# Patient Record
Sex: Male | Born: 1960
Health system: Southern US, Community
[De-identification: ages and names within clinical notes are randomized; demographics above are authoritative.]

## PROBLEM LIST (undated history)

## (undated) DIAGNOSIS — A879 Viral meningitis, unspecified: Secondary | ICD-10-CM

## (undated) DIAGNOSIS — K602 Anal fissure, unspecified: Secondary | ICD-10-CM

## (undated) DIAGNOSIS — N486 Induration penis plastica: Secondary | ICD-10-CM

## (undated) DIAGNOSIS — N5201 Erectile dysfunction due to arterial insufficiency: Secondary | ICD-10-CM

## (undated) DIAGNOSIS — B003 Herpesviral meningitis: Secondary | ICD-10-CM

## (undated) DIAGNOSIS — N400 Enlarged prostate without lower urinary tract symptoms: Secondary | ICD-10-CM

## (undated) HISTORY — DX: Herpesviral meningitis: B00.3

## (undated) HISTORY — PX: COLONOSCOPY: SHX174

## (undated) HISTORY — DX: Anal fissure, unspecified: K60.2

## (undated) HISTORY — DX: Benign prostatic hyperplasia without lower urinary tract symptoms: N40.0

## (undated) HISTORY — DX: Viral meningitis, unspecified: A87.9

## (undated) HISTORY — PX: POLYPECTOMY: SHX149

## (undated) HISTORY — DX: Induration penis plastica: N48.6

## (undated) HISTORY — DX: Erectile dysfunction due to arterial insufficiency: N52.01

---

## 2005-06-15 ENCOUNTER — Ambulatory Visit: Payer: Self-pay | Admitting: Internal Medicine

## 2005-07-03 ENCOUNTER — Ambulatory Visit: Payer: Self-pay | Admitting: Internal Medicine

## 2005-09-21 ENCOUNTER — Ambulatory Visit: Payer: Self-pay | Admitting: Internal Medicine

## 2006-06-21 ENCOUNTER — Ambulatory Visit: Payer: Self-pay | Admitting: Internal Medicine

## 2006-07-08 ENCOUNTER — Telehealth (INDEPENDENT_AMBULATORY_CARE_PROVIDER_SITE_OTHER): Payer: Self-pay | Admitting: *Deleted

## 2006-07-08 ENCOUNTER — Encounter (INDEPENDENT_AMBULATORY_CARE_PROVIDER_SITE_OTHER): Payer: Self-pay | Admitting: *Deleted

## 2006-07-09 ENCOUNTER — Telehealth (INDEPENDENT_AMBULATORY_CARE_PROVIDER_SITE_OTHER): Payer: Self-pay | Admitting: *Deleted

## 2009-03-30 ENCOUNTER — Ambulatory Visit: Payer: Self-pay | Admitting: Internal Medicine

## 2009-03-30 DIAGNOSIS — L723 Sebaceous cyst: Secondary | ICD-10-CM | POA: Insufficient documentation

## 2009-03-30 DIAGNOSIS — R42 Dizziness and giddiness: Secondary | ICD-10-CM | POA: Insufficient documentation

## 2009-03-30 DIAGNOSIS — K921 Melena: Secondary | ICD-10-CM | POA: Insufficient documentation

## 2009-03-31 ENCOUNTER — Encounter (INDEPENDENT_AMBULATORY_CARE_PROVIDER_SITE_OTHER): Payer: Self-pay | Admitting: *Deleted

## 2009-04-07 ENCOUNTER — Telehealth (INDEPENDENT_AMBULATORY_CARE_PROVIDER_SITE_OTHER): Payer: Self-pay | Admitting: *Deleted

## 2010-02-05 LAB — CONVERTED CEMR LAB
ALT: 23 units/L (ref 0–53)
AST: 22 units/L (ref 0–37)
BUN: 11 mg/dL (ref 6–23)
Basophils Absolute: 0 10*3/uL (ref 0.0–0.1)
Basophils Relative: 0.6 % (ref 0.0–3.0)
CO2: 30 meq/L (ref 19–32)
Calcium: 9.1 mg/dL (ref 8.4–10.5)
Chloride: 106 meq/L (ref 96–112)
Cholesterol: 184 mg/dL (ref 0–200)
Creatinine, Ser: 1 mg/dL (ref 0.4–1.5)
Eosinophils Absolute: 0.5 10*3/uL (ref 0.0–0.7)
Eosinophils Relative: 8.1 % — ABNORMAL HIGH (ref 0.0–5.0)
GFR calc non Af Amer: 84.35 mL/min (ref >60)
Glucose, Bld: 99 mg/dL (ref 70–99)
HCT: 43.5 % (ref 39.0–52.0)
HDL: 55.5 mg/dL (ref >39.00)
Hemoglobin: 14.6 g/dL (ref 13.0–17.0)
LDL Cholesterol: 99 mg/dL (ref 0–99)
Lymphocytes Relative: 24.8 % (ref 12.0–46.0)
Lymphs Abs: 1.4 10*3/uL (ref 0.7–4.0)
MCHC: 33.6 g/dL (ref 30.0–36.0)
MCV: 96.4 fL (ref 78.0–100.0)
Monocytes Absolute: 0.5 10*3/uL (ref 0.1–1.0)
Monocytes Relative: 8.4 % (ref 3.0–12.0)
Neutro Abs: 3.4 10*3/uL (ref 1.4–7.7)
Neutrophils Relative %: 58.1 % (ref 43.0–77.0)
PSA: 0.94 ng/mL (ref 0.10–4.00)
Platelets: 187 10*3/uL (ref 150.0–400.0)
Potassium: 4.4 meq/L (ref 3.5–5.1)
RBC: 4.51 M/uL (ref 4.22–5.81)
RDW: 12.3 % (ref 11.5–14.6)
Sodium: 139 meq/L (ref 135–145)
TSH: 0.95 microintl units/mL (ref 0.35–5.50)
Total CHOL/HDL Ratio: 3
Triglycerides: 148 mg/dL (ref 0.0–149.0)
VLDL: 29.6 mg/dL (ref 0.0–40.0)
WBC: 5.8 10*3/uL (ref 4.5–10.5)

## 2010-02-07 NOTE — Assessment & Plan Note (Signed)
Summary: HIT HIM THE EYE   PH   Vital Signs:  Patient Profile:   50 Years Old Male Weight:      174.8 pounds Temp:     98.4 degrees F oral Pulse rate:   92 / minute BP sitting:   110 / 80  (left arm) Cuff size:   large  Vitals Entered By: Shary Decamp (June 21, 2006 8:59 AM)               Chief Complaint:  was weed-eating yest - hit in left eye with piece of mulch.  History of Present Illness: was working in his yard yesterday afternoon and a piece of mulch fly into his eye.  He washed his eye throughly and he  thinks, but is not sure, that he go the piece of mulch out  Current Allergies: No known allergies      Review of Systems       vision is  normal-, denies eye pain.   Physical Exam  Eyes:     right eye normal left Eye: pupil reactive, anterior chamber normal , has a dense  conjunctival hemorrhage on the external aspect.  External ocular movements are intact. Fluorescein exam; grossly normal, minute FB @ two o'clock in the line betwen the cornea and conjuntiva??    Impression & Recommendations:  Problem # 1:  FOREIGN BODY ON EXTERNAL EYE NOS (ICD-930.9) declined to see the opht. today Orders: Ophthalmology Referral (Ophthalmology)   Medications Added to Medication List This Visit: 1)  Bayer Low Strength 81 Mg Tbec (Aspirin)   Patient Instructions: 1)  use artificial tears throughout the weekend to refresh your  eye. 2)  If your vision change, you have discharge from the eye or eye pain: go  to the emergency room. 3)  We will try to set up an appointment to see the eye doctor on Monday.

## 2010-02-07 NOTE — Progress Notes (Signed)
----   Converted from flag ---- ---- 07/08/2006 5:20 PM, Shary Decamp wrote: mailed letter to schedule f/u visit with dr. Drue Novel  ---- 07/05/2006 12:51 PM, Shary Decamp wrote: Left message on machine to return call.  ---- 07/01/2006 4:39 PM, Shary Decamp wrote: Left message on machine to return call.  ---- 07/01/2006 6:38 AM, Nolon Rod. Paz MD wrote: no, please advice pt. to see ophtalmology (or optometrist )  ---- 06/28/2006 4:25 PM, Shary Decamp wrote: Pt did not see opth - they had conflicting schedules.  Eye does look a little better. Still red, no swelling, no pain, no changes in vision.  Do you want him to f/u with you?  ---- 06/28/2006 10:38 AM, Shary Decamp wrote: Left message on machine to return call.    ---- 06/25/2006 10:45 AM, Shary Decamp wrote: Left message on machine to return call.  ---- 06/24/2006 2:59 PM, Jose E. Paz MD wrote: Alena Bills, did he go to ophttalmology?? ------------------------------

## 2010-02-07 NOTE — Letter (Signed)
Summary: New Patient letter  [facility]  [address] [place], [place] [address]   Phone: [phone]  Fax: [phone]       03/31/2009 MRN: [id number]  John Harper [address] [place], [place]  [address]  Dear John Harper,  Welcome to the Gastroenterology Division at [facility].    You are scheduled to see Dr.  Christella Hartigan on 05-06-09 at 2:30pm on the 3rd floor at [facility], [address]  We ask that you try to arrive at our office 15 minutes prior to your appointment time to allow for check-in.  We would like you to complete the enclosed self-administered evaluation form prior to your visit and bring it with you on the day of your appointment.  We will review it with you.  Also, please bring a complete list of all your medications or, if you prefer, bring the medication bottles and we will list them.  Please bring your insurance card so that we may make a copy of it.  If your insurance requires a referral to see a specialist, please bring your referral form from your primary care physician.  Co-payments are due at the time of your visit and may be paid by cash, check or credit card.     Your office visit will consist of a consult with your physician (includes a physical exam), any laboratory testing he/she may order, scheduling of any necessary diagnostic testing (e.g. x-ray, ultrasound, CT-scan), and scheduling of a procedure (e.g. Endoscopy, Colonoscopy) if required.  Please allow enough time on your schedule to allow for any/all of these possibilities.    If you cannot keep your appointment, please call [phone] to cancel or reschedule prior to your appointment date.  This allows [place] the opportunity to schedule an appointment for another patient in need of care.  If you do not cancel or reschedule by 5 p.m. the business day prior to your appointment date, you will be charged a $50.00 late cancellation/no-show fee.    Thank you for choosing [facility] for your medical needs.  We appreciate the opportunity to care for you.  Please visit [place] at our website  to learn more about our practice.                     Sincerely,                                                             The Gastroenterology Division

## 2010-02-07 NOTE — Letter (Signed)
Summary: Generic Letter  [facility] [place]  [address] [place], [place] [address]   Phone: [phone]  Fax: [phone]    07/08/2006  John Harper [address] [place], [place]  [address]    Dear Mr. HORRIGAN,   Please call [phone] to schedule a follow up visit with Dr. Drue Novel.      Sincerely,   Shary Decamp [facility]

## 2010-02-07 NOTE — Progress Notes (Signed)
Summary: Mail Lab Results  Phone Note Call from Patient Call back at Home Phone [phone]   Caller: Patient Reason for Call: Lab or Test Results Summary of Call: Patient called and requested that we mail him all of his lab results. He said we mailed him only the results that pertained to a sheet to fill out for his work. I mailed all the results out on 3.30.11 Initial call taken by: Harold Barban,  April 07, 2009 1:37 PM

## 2010-02-07 NOTE — Progress Notes (Signed)
Summary: lmom needs f/u c opth  Phone Note Call from Patient   Summary of Call: pt returned call  called pt back - left message on machine that dr Drue Novel wanted him to f/u with opth - advised to call me and let me know how his eye was doing - f/u here as needed ..................................................................Marland KitchenShary Decamp  July 09, 2006 10:48 AM

## 2010-02-07 NOTE — Assessment & Plan Note (Signed)
Summary: cpx/kdc   Vital Signs:  Patient profile:   50 year old male Height:      67.5 inches Weight:      183.2 pounds BMI:     28.37 Pulse rate:   72 / minute BP sitting:   140 / 80  Vitals Entered By: Shary Decamp (March 30, 2009 8:09 AM)  History of Present Illness: CPX has several other issues:  01-2008: -quit tobacco -started running  -change diet since then has noted hard stools , frecuently sees blood in stools ( drops in the toilet paper) , feels likes he had a "cut" in the anus  that hurts sometimes when he has a BM   x 2 years: dizzy when looks up , when plays around w/  his kids : no LOC, nausea or headaches symptoms gone recently   bump in the back  no change x years, no pain   Preventive Screening-Counseling & Management  Alcohol-Tobacco     Smoking Status: quit     Year Quit: 01/09/2008  Caffeine-Diet-Exercise     Caffeine use/day: 1     Does Patient Exercise: yes     Times/week: 3      Drug Use:  no.    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 CV:  Denies chest pain or discomfort and swelling of feet. Resp:  Denies cough and shortness of breath. GI:  Denies abdominal pain, diarrhea, nausea, and vomiting. GU:  Denies dysuria, hematuria, urinary frequency, and urinary hesitancy.  Physical Exam  General:   alert, well-developed, and well-nourished.   Neck:  no masses, no thyromegaly, and normal carotid upstroke.   Lungs:  normal respiratory effort, no intercostal retractions, no accessory muscle use, and normal breath sounds.   Heart:  normal rate, regular rhythm, no murmur, and no gallop.   Abdomen:  soft, non-tender, normal bowel sounds, no distention, no masses, no guarding, and no rigidity.   Rectal:  No external abnormalities noted. Normal sphincter tone. No rectal masses or tenderness. ANOSCOPY: several internal hemorrhoids, < than 1 cm each, no active bleeding, mild erythema, no fissure  noted today Prostate:  Prostate gland firm and smooth, slt  enlargement, no nodularity, tenderness, mass, asymmetry or induration. Msk:  has a 3 x 3 inch soft, nontender, mobile mass in the left sided mid back consistent with a cyst Extremities:  no edema Neurologic:  alert & oriented X3, strength normal in all extremities, and gait normal.   Psych:  not anxious appearing and not depressed appearing.     Impression & Recommendations:  Problem # 1:  ROUTINE GENERAL MEDICAL EXAM@HEALTH  CARE FACL (ICD-V70.0) EKG within normal Td 07 praised for his healthy lifestyle labs  Orders: EKG w/ Interpretation (93000) Venipuncture ([address]) TLB-ALT (SGPT) (84460-ALT) TLB-AST (SGOT) (84450-SGOT) TLB-BMP (Basic Metabolic Panel-BMET) (80048-METABOL) TLB-CBC  Platelet - w/Differential (85025-CBCD) TLB-Lipid Panel (80061-LIPID) TLB-TSH (Thyroid Stimulating Hormone) (84443-TSH) TLB-PSA (Prostate Specific Antigen) (84153-PSA)  Problem # 2:  SEBACEOUS CYST (ICD-706.2) Assessment: New recommend observation, patient to call if size  change or the cyst  cause discomfort  Problem # 3:  DIZZINESS (ICD-780.4) Assessment: New had dizziness for two years, benign features  ( dizzy with  head position chasnge)  recommend observation  Problem # 4:  HEMATOCHEZIA (ICD-578.1) Assessment: New  likely due to  anal   pathology given the description of her symptoms he is however 50, I will refer him to GI to rule-out a more proximal source of bleeding  Orders: Anoscopy ([address]) Gastroenterology Referral (GI)  Problem # 5:  pt has other issues to talk about rec to schedule a OV in addition to the CPX we talk about 3 more items, time spent 45 min  Complete Medication List: 1)  Bayer Low Strength 81 Mg Tbec (Aspirin) 2)  Analpram-hc 1-2.5 % Crea (Hydrocortisone ace-pramoxine) .... Apply twice a day for one week  Prescriptions: ANALPRAM-HC 1-2.5 % CREA (HYDROCORTISONE ACE-PRAMOXINE) apply twice a day for one week  #1 x 0   Entered and Authorized by:   Nolon Rod. Seger Jani MD   Signed by:   Nolon Rod. Daleen Steinhaus MD on 03/30/2009   Method used:   Print then Give to Patient   RxID:   [id number]    Immunization History:  Tetanus/Td Immunization History:    Tetanus/Td:  td (06/15/2005)    Risk Factors:  Tobacco use:  quit    Year quit:  01/09/2008 Drug use:  no Caffeine use:  1 drinks per day Alcohol use:  yes    Comments:  drinks on avg - 10-12 drinks a week Exercise:  yes    Times per week:  3

## 2010-08-21 ENCOUNTER — Ambulatory Visit (AMBULATORY_SURGERY_CENTER): Payer: BC Managed Care – PPO | Admitting: *Deleted

## 2010-08-21 ENCOUNTER — Encounter: Payer: Self-pay | Admitting: Internal Medicine

## 2010-08-21 VITALS — Ht 67.0 in | Wt 165.3 lb

## 2010-08-21 DIAGNOSIS — Z1211 Encounter for screening for malignant neoplasm of colon: Secondary | ICD-10-CM

## 2010-08-21 MED ORDER — PEG-KCL-NACL-NASULF-NA ASC-C 100 G PO SOLR: 1.0000 | Freq: Once | ORAL | Status: DC

## 2010-08-21 NOTE — Progress Notes (Signed)
Moviprep rx printed, signed by Dr. Rhea Belton and given to pt.

## 2010-09-06 ENCOUNTER — Ambulatory Visit (AMBULATORY_SURGERY_CENTER): Payer: BC Managed Care – PPO | Admitting: Internal Medicine

## 2010-09-06 ENCOUNTER — Encounter: Payer: Self-pay | Admitting: Internal Medicine

## 2010-09-06 VITALS — BP 148/81 | HR 64 | Temp 97.4°F | Resp 18 | Ht 67.0 in | Wt 161.0 lb

## 2010-09-06 DIAGNOSIS — D126 Benign neoplasm of colon, unspecified: Secondary | ICD-10-CM

## 2010-09-06 DIAGNOSIS — Z1211 Encounter for screening for malignant neoplasm of colon: Secondary | ICD-10-CM

## 2010-09-06 MED ORDER — SODIUM CHLORIDE 0.9 % IV SOLN: 500.0000 mL | INTRAVENOUS | Status: DC

## 2010-09-06 NOTE — Patient Instructions (Signed)
Please refer to blue and green discharge instruction sheets and polyp hand-out.

## 2010-09-07 ENCOUNTER — Telehealth: Payer: Self-pay | Admitting: *Deleted

## 2010-09-07 NOTE — Telephone Encounter (Signed)

## 2010-09-15 ENCOUNTER — Encounter: Payer: Self-pay | Admitting: Internal Medicine

## 2010-09-29 ENCOUNTER — Telehealth: Payer: Self-pay | Admitting: Internal Medicine

## 2010-09-29 NOTE — Telephone Encounter (Signed)
Phoned pt but he was in a store and will call me back.

## 2010-10-03 ENCOUNTER — Telehealth: Payer: Self-pay | Admitting: Internal Medicine

## 2010-10-03 NOTE — Telephone Encounter (Signed)
Spoke with pt and discussed his letter sent after bx results were received. Pt stated understanding.

## 2010-11-02 ENCOUNTER — Encounter: Payer: Self-pay | Admitting: Internal Medicine

## 2010-11-02 ENCOUNTER — Emergency Department (HOSPITAL_COMMUNITY): Payer: BC Managed Care – PPO

## 2010-11-02 ENCOUNTER — Ambulatory Visit (INDEPENDENT_AMBULATORY_CARE_PROVIDER_SITE_OTHER): Payer: BC Managed Care – PPO | Admitting: Internal Medicine

## 2010-11-02 ENCOUNTER — Emergency Department (HOSPITAL_COMMUNITY)
Admission: EM | Admit: 2010-11-02 | Discharge: 2010-11-02 | Disposition: A | Discharge status: Home / Self Care | Payer: BC Managed Care – PPO | Attending: Emergency Medicine | Admitting: Emergency Medicine

## 2010-11-02 VITALS — BP 108/68 | HR 92 | Temp 98.7°F | Resp 16 | Wt 169.0 lb

## 2010-11-02 DIAGNOSIS — A879 Viral meningitis, unspecified: Secondary | ICD-10-CM | POA: Insufficient documentation

## 2010-11-02 DIAGNOSIS — R51 Headache: Secondary | ICD-10-CM

## 2010-11-02 DIAGNOSIS — R110 Nausea: Secondary | ICD-10-CM

## 2010-11-02 DIAGNOSIS — M545 Low back pain, unspecified: Secondary | ICD-10-CM

## 2010-11-02 DIAGNOSIS — R8290 Unspecified abnormal findings in urine: Secondary | ICD-10-CM

## 2010-11-02 LAB — DIFFERENTIAL
Basophils Absolute: 0 10*3/uL (ref 0.0–0.1)
Basophils Relative: 0 % (ref 0–1)
Eosinophils Absolute: 0.1 10*3/uL (ref 0.0–0.7)
Eosinophils Relative: 1 % (ref 0–5)
Lymphocytes Relative: 11 % — ABNORMAL LOW (ref 12–46)
Lymphs Abs: 1.1 10*3/uL (ref 0.7–4.0)
Monocytes Absolute: 0.9 10*3/uL (ref 0.1–1.0)
Monocytes Relative: 10 % (ref 3–12)
Neutro Abs: 7.2 10*3/uL (ref 1.7–7.7)
Neutrophils Relative %: 78 % — ABNORMAL HIGH (ref 43–77)

## 2010-11-02 LAB — CSF CELL COUNT WITH DIFFERENTIAL
Eosinophils, CSF: 0 % (ref 0–1)
Eosinophils, CSF: 0 % (ref 0–1)
Lymphs, CSF: 42 % (ref 40–80)
Lymphs, CSF: 59 % (ref 40–80)
Monocyte-Macrophage-Spinal Fluid: 16 % (ref 15–45)
Monocyte-Macrophage-Spinal Fluid: 19 % (ref 15–45)
Other Cells, CSF: 0
Other Cells, CSF: 0
RBC Count, CSF: 20 /mm3 — ABNORMAL HIGH
RBC Count, CSF: 93 /mm3 — ABNORMAL HIGH
Segmented Neutrophils-CSF: 22 % — ABNORMAL HIGH (ref 0–6)
Segmented Neutrophils-CSF: 42 % — ABNORMAL HIGH (ref 0–6)
Tube #: 1
Tube #: 4
WBC, CSF: 230 /mm3 (ref 0–5)
WBC, CSF: 273 /mm3 (ref 0–5)

## 2010-11-02 LAB — CBC
HCT: 44.9 % (ref 39.0–52.0)
Hemoglobin: 14.9 g/dL (ref 13.0–17.0)
MCH: 31 pg (ref 26.0–34.0)
MCHC: 33.2 g/dL (ref 30.0–36.0)
MCV: 93.5 fL (ref 78.0–100.0)
Platelets: 192 10*3/uL (ref 150–400)
RBC: 4.8 MIL/uL (ref 4.22–5.81)
RDW: 13 % (ref 11.5–15.5)
WBC: 9.3 10*3/uL (ref 4.0–10.5)

## 2010-11-02 LAB — POCT URINALYSIS DIPSTICK
Glucose, UA: NEGATIVE
Leukocytes, UA: NEGATIVE
Nitrite, UA: NEGATIVE
Spec Grav, UA: 1.03
Urobilinogen, UA: 0.2
pH, UA: 6

## 2010-11-02 LAB — BASIC METABOLIC PANEL
BUN: 9 mg/dL (ref 6–23)
CO2: 26 mEq/L (ref 19–32)
Calcium: 9 mg/dL (ref 8.4–10.5)
Chloride: 100 mEq/L (ref 96–112)
Creatinine, Ser: 0.85 mg/dL (ref 0.50–1.35)
GFR calc Af Amer: >90 mL/min (ref >90)
GFR calc non Af Amer: >90 mL/min (ref >90)
Glucose, Bld: 101 mg/dL — ABNORMAL HIGH (ref 70–99)
Potassium: 3.5 mEq/L (ref 3.5–5.1)
Sodium: 136 mEq/L (ref 135–145)

## 2010-11-02 LAB — GRAM STAIN

## 2010-11-02 LAB — GLUCOSE, CSF: Glucose, CSF: 55 mg/dL (ref 43–76)

## 2010-11-02 LAB — PROTEIN, CSF: Total  Protein, CSF: 144 mg/dL — ABNORMAL HIGH (ref 15–45)

## 2010-11-02 NOTE — Progress Notes (Signed)
  Subjective:    Patient ID: John Harper, male    DOB: 1960/02/27, 50 y.o.   MRN: [id number]  HPI Chief complaint today is headache, developed very intense ("massive") headache yesterday at noon, headache is continue, did not respond to Advil, unable to sleep or eat due to the pain. The pain is global but more noticeable behind the eyes and at the nuchal area. He has developed mild nausea but no vomiting. This is for sure  " the worse headache of his life". He also has mild back ache without radiation. Backache also started yesterday.  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 Denies any sinus pain or discharge. No dysuria, gross hematuria or difficulty urinating. No abdominal pain or diarrhea Denies any head injury, dizziness or visual disturbances. Admits to some neck stiffness     Objective:   Physical Exam  Constitutional: He is oriented to person, place, and time. He appears well-developed and well-nourished. No distress.  HENT:  Head: Normocephalic and atraumatic.  Neck: Normal range of motion. Neck supple.  Cardiovascular: Normal rate, regular rhythm and normal heart sounds.   No murmur heard. Pulmonary/Chest: Effort normal and breath sounds normal. No respiratory distress. He has no wheezes. He has no rales.  Abdominal: Soft. He exhibits no distension. There is no tenderness. There is no rebound and no guarding.  Musculoskeletal: He exhibits no edema.  Neurological: He is alert and oriented to person, place, and time.       Motor,gait and speech normal. DTRs symmetric.  Cranial nerves intact.  Skin: He is not diaphoretic.  Psychiatric: He has a normal mood and affect. His behavior is normal. Judgment and thought content normal.          Assessment & Plan:  New onset of severe headache: Although the neurological exam is normal and he's not in distress,  I'm  concern about a intracranial bleeding. I think he needs further workup, discuss with Dr. Ignacia Palma at the ER. Will refer patient there. If the CT is negative he may need a neurological consult  or even the LP to rule out a ICB. Pt understood my concern   Addendum: Patient was seen at the ER, CT negative, LP consistent with viral meningitis, was recommended admission but he declined. I called the patient today to check on him, left a message. Patient to let me know if problems. Also urinalysis yesterday was sent for urine culture. Results pending.

## 2010-11-03 LAB — PATHOLOGIST SMEAR REVIEW

## 2010-11-04 LAB — CULTURE, URINE COMPREHENSIVE
Colony Count: NO GROWTH
Organism ID, Bacteria: NO GROWTH

## 2010-11-06 LAB — CSF CULTURE W GRAM STAIN
Culture: NO GROWTH
Gram Stain: NONE SEEN

## 2010-11-06 LAB — CULTURE, BLOOD (ROUTINE X 2): Culture  Setup Time: 201210252213

## 2010-11-07 ENCOUNTER — Telehealth: Payer: Self-pay | Admitting: Internal Medicine

## 2010-11-07 NOTE — Telephone Encounter (Signed)
Patient had test [phone] -dx viral meningitis - he wants to know if he is still contagious since he is taking med -   he wants to know if he needs follow up appt with dr Drue Novel - he doesn't want to come in if he doesn't need to

## 2010-11-07 NOTE — Telephone Encounter (Signed)
Appointment scheduled.

## 2010-11-08 ENCOUNTER — Encounter: Payer: Self-pay | Admitting: Internal Medicine

## 2010-11-08 ENCOUNTER — Ambulatory Visit (INDEPENDENT_AMBULATORY_CARE_PROVIDER_SITE_OTHER): Payer: BC Managed Care – PPO | Admitting: Internal Medicine

## 2010-11-08 VITALS — BP 108/68 | HR 67 | Temp 98.3°F | Resp 18 | Wt 169.2 lb

## 2010-11-08 DIAGNOSIS — A879 Viral meningitis, unspecified: Secondary | ICD-10-CM

## 2010-11-08 LAB — CULTURE, BLOOD (ROUTINE X 2)
Culture  Setup Time: 201210252212
Culture: NO GROWTH

## 2010-11-08 NOTE — Assessment & Plan Note (Signed)
Recovering well from viral meningitis.  He has family members come in tomorrow, concerned about passing the infection to others. Although I can't be 100% sure, I think that is very unlikely given the fact that he is afebrile and recovering well. Information from up-to-date provided to the patient. Patient will call if he's not completely normal soon

## 2010-11-08 NOTE — Telephone Encounter (Signed)
Has an appointment today this afternoon and is reported that he is doing very well no fever or headaches 11/08/10

## 2010-11-08 NOTE — Telephone Encounter (Signed)
Agreed that he needs to follow up. Please call the patient, if he is feeling better, not having fever and the headache has decreased okay to keep the appointment he has in  a couple of days. If he is having problems, let me know.

## 2010-11-08 NOTE — Progress Notes (Signed)
  Subjective:    Patient ID: John Harper, male    DOB: 06-17-1960, 50 y.o.   MRN: [id number]  HPI Followup from the ER visit. He was diagnosed with viral meningitis; CBC, BMP were normal. Blood cultures showed STAPHYLOCOCCUS SPECIES (COAGULASE NEGATIVE), likely a contaminant   Past Medical History  Diagnosis Date  . Hemorrhoids   . Anal fissure    No past surgical history on file.     Review of Systems At this point he is better, headache is essentially resolved, he has some residual back pain with exertion. No fever or chills No nausea or vomiting No cough or runny nose.     Objective:   Physical Exam  Constitutional: He is oriented to person, place, and time. He appears well-developed and well-nourished. No distress.  Cardiovascular: Normal rate, regular rhythm and normal heart sounds.   No murmur heard. Pulmonary/Chest: Effort normal and breath sounds normal. No respiratory distress. He has no wheezes. He has no rales.  Musculoskeletal: He exhibits no edema.  Neurological: He is alert and oriented to person, place, and time. He has normal reflexes. He displays normal reflexes. He exhibits normal muscle tone. Coordination normal.       Face symmetric, speech normal  Skin: He is not diaphoretic.          Assessment & Plan:

## 2010-11-10 ENCOUNTER — Ambulatory Visit: Payer: BC Managed Care – PPO | Admitting: Internal Medicine

## 2011-01-09 DIAGNOSIS — A879 Viral meningitis, unspecified: Secondary | ICD-10-CM

## 2011-01-09 HISTORY — DX: Viral meningitis, unspecified: A87.9

## 2013-08-21 ENCOUNTER — Encounter: Payer: Self-pay | Admitting: Internal Medicine

## 2013-09-16 ENCOUNTER — Telehealth: Payer: Self-pay

## 2013-09-16 NOTE — Telephone Encounter (Signed)
Pt received recall letter and is concerned because he had a $3,000 deductible. Pt wants to know if Dr. Hilarie Fredrickson really thinks he needs the colon at this time or is there something else that can be ordered. Please advise.

## 2013-09-17 NOTE — Telephone Encounter (Signed)
Patient has history of multiple adenomatous polyps removed 3 years ago in initial screening colonoscopy National surveillance guidelines support repeat screening colonoscopy 3 years from initial exam based on the number of polyps removed, this is also my recommendation I understand the test is expensive, but given his history of polyps there are no other superior options for surveillance of polyps If he wants to wait to early next year (2016) when his to deductible restarts this would be acceptable, but my recommendation remains for repeat colonoscopy at this time (or close to this time)

## 2013-09-18 NOTE — Telephone Encounter (Signed)
Spoke with pt and he is aware, states he will think about it and call back.

## 2013-10-06 ENCOUNTER — Encounter: Payer: Self-pay | Admitting: Internal Medicine

## 2013-10-09 ENCOUNTER — Ambulatory Visit (INDEPENDENT_AMBULATORY_CARE_PROVIDER_SITE_OTHER): Payer: BC Managed Care – PPO | Admitting: Internal Medicine

## 2013-10-09 ENCOUNTER — Encounter: Payer: Self-pay | Admitting: Internal Medicine

## 2013-10-09 VITALS — BP 128/58 | HR 63 | Temp 98.1°F | Wt 172.4 lb

## 2013-10-09 DIAGNOSIS — N529 Male erectile dysfunction, unspecified: Secondary | ICD-10-CM

## 2013-10-09 DIAGNOSIS — L989 Disorder of the skin and subcutaneous tissue, unspecified: Secondary | ICD-10-CM

## 2013-10-09 DIAGNOSIS — M720 Palmar fascial fibromatosis [Dupuytren]: Secondary | ICD-10-CM

## 2013-10-09 MED ORDER — TADALAFIL 20 MG PO TABS: 10.0000 mg | ORAL_TABLET | ORAL | Status: DC | PRN

## 2013-10-09 MED ORDER — CLINDAMYCIN PHOSPHATE 1 % EX GEL: Freq: Two times a day (BID) | CUTANEOUS | Status: DC

## 2013-10-09 NOTE — Patient Instructions (Addendum)
Use the cream twice a day until you see dermatology  Please schedule a physical exam, fasting, at your convenience      Dupuytren's Contracture Dupuytren's contracture affects the fingers and the palm of the hand. This condition usually develops slowly. It may take many years to develop. The pinky finger and the ring finger are most often affected. These fingers start to curve inward, like a claw. At some point, the fingers cannot go straight anymore. This can make it hard to do things like:  Put on gloves.  Shake hands.  Grab something off a shelf. The condition usually does not cause pain and is not dangerous. The condition gets its name from the doctor who came up with an operation to fix the problem. His name was Lanney Gins Dupuytren. Contracture means pulling inward. CAUSES  Dupuytren's contracture does not start with the fingers. It starts in the palm of the hand, under the skin. The tissue under the skin is called fascia. The fascia covers the cords (tendons) that control how the fingers move. In Dupuytren's contracture the fascia tissue becomes thick and then pulls on the cords. That causes the fingers to curl. The condition can affect both hands and any fingers, but it usually strikes one hand worse than the other. The fingers farthest from the thumb are most often the ones that curl. The cause is not clear. Some experts believe it results from an autoimmune reaction. That means the body's immune system (which fights off disease) attacks itself by mistake. What experts do know is that certain conditions and behaviors (called risk factors) make the chance of having this condition more likely. They include:  Age. Most people who have the condition are older than 50.  Sex. It affects men more often than women.  Family history. The condition tends to run in families from countries in [place] and [place].  Certain behaviors. People who smoke and drink alcohol are  more apt to develop the problem.  Some other medical conditions. Having diabetes makes Dupuytren's contracture more likely. So does having a condition that involves a seizure (when the brain's function is interrupted). SYMPTOMS  Signs of this condition take time to develop. Sometimes this takes weeks or months. More often, it takes several years.   Early symptoms:  Skin on the palm of the hand becomes thick. This is usually the first sign.  The skin may look dimpled or puckered.  Lumps (nodules) show up on the palm. There may be one or more lumps. They are not painful.  Later symptoms:  Thick cords of tissue form in the palm of the hand.  The pinky and ring fingers start to curl up into the palm.  The fingers cannot be straightened into their normal position. DIAGNOSIS  A physical examination is the main way that a healthcare provider can tell if you have Dupuytren's contracture. Other tests usually are not needed. The caregiver will probably:  Look at your hands. Feel your hands. This is to check for thickening and nodules.  Measure finger motion. This tells how much your fingers have contracted (pulled in).  Do a tabletop test. You will be asked to try to put your hand flat on a table, palm down. TREATMENT  There is no cure for Dupuytren's contracture. But there are ways to treat the symptoms. Options include:  Watching and waiting. The condition develops slowly. Often it does not create problems for a long time. Sometimes the skin gets thick and nodules  form, but the fingers never curl. So, in some cases it is best to just watch the condition carefully and wait to see what happens.  Shots (injections). Different substances may be injected, including:  Steroids. These drugs block swelling. These shots should make the condition less uncomfortable. Steroids may also slow down the condition. Shots are given into the nodules. The effect only lasts awhile. More shots may have to be  given.  Enzymes. These are proteins. They weaken the thick tissue. After an injection, the caregiver usually stretches the fingers.  Needling. A needle is pushed through the skin and into the thick tissue. This is done in several spots. The goal is to break up the thickened tissue. Or to weaken it.  Surgery. This may be suggested if you cannot grasp objects. Or, if you can no longer put your hand in your pocket.  A cut (incision) is made in the palm of the hand. The thick tissue is removed.  Sometimes the thick tissue is attached to the skin. Then, the skin must be removed, too. It is replaced with a piece of skin from another place on your body. That is called a skin graft.  Occupational or hand therapy is almost always needed after surgery. This involves special exercises to get back the use of your hand and fingers. After a skin graft, several months of therapy may be needed.  Sometimes the condition comes back, even after surgery.  Other methods. You can do some things on your own. They include:  Stretching the fingers backwards. Do this often.  Warming the hand and massaging it. Again, do this often.  Using tools with padded grips. This should make things easier.  Wearing heavy gloves while working. This protects the hands. PROGNOSIS  Dupuytren's contracture usually develops slowly. There is no cure. But, the symptoms can be treated. Sometimes they come back after treatment, but not always. It is important to remember that this is a functional problem and not a life-threatening condition. Document Released: 10/22/2008 Document Revised: 03/19/2011 Document Reviewed: 10/22/2008 [facility] Patient Information 2015 [place], [place]. This information is not intended to replace advice given to you by your health care provider. Make sure you discuss any questions you have with your health care provider.

## 2013-10-09 NOTE — Progress Notes (Signed)
   Subjective:    Patient ID: John Harper, male    DOB: Apr 21, 1960, 53 y.o.   MRN: [id number]  DOS:  10/09/2013 Type of visit - description : new patient Here to discuss the following issues: Skin lesion at the right side of the face for more than a year Bilateral cheek redness also for a while. History of ED, takes Cialis  sporadically, would like a refill. For the last 2 or 3 months, has noted the penis to bend upward w/ erections. Does not have the sx necessarily when he takes Cialis, never had a prolonged erection, no pain associated with this phenomena.    ROS Denies any suicidal, gross hematuria and difficulty urinating. No abnormalities noted in his hands.  Past Medical History  Diagnosis Date  . Hemorrhoids   . Anal fissure     Past Surgical History  Procedure Laterality Date  . No past surgeries      History   Social History  . Marital Status: Single    Spouse Name: N/A    Number of Children: 2  . Years of Education: N/A   Occupational History  . sales rep    Social History Main Topics  . Smoking status: Former Research scientist (life sciences)  . Smokeless tobacco: Never Used     Comment: quit 2010  . Alcohol Use: 7.5 oz/week    15 drink(s) per week  . Drug Use: No  . Sexual Activity: Not on file   Other Topics Concern  . Not on file   Social History Narrative   Lives by himself   Twins, boy-girl     Family History  Problem Relation Age of Onset  . Pancreatic cancer Father   . Coronary artery disease Father 36    CABG x2  . Cancer Father 48    kidney...prostate  . Colon cancer Neg Hx   . Stomach cancer Neg Hx   . Esophageal cancer Neg Hx   . Stroke Mother   . Hypertension Brother   . Diabetes Other     aunts and uncles      Medication List       This list is accurate as of: 10/09/13 11:59 PM.  Always use your most recent med list.               aspirin 81 MG tablet  Take 81 mg by mouth daily.     clindamycin 1 % gel  Commonly known as:  CLINDAGEL    Apply topically 2 (two) times daily.     tadalafil 20 MG tablet  Commonly known as:  CIALIS  Take 0.5-1 tablets (10-20 mg total) by mouth every other day as needed for erectile dysfunction.           Objective:   Physical Exam  Constitutional: He appears well-developed and well-nourished.  HENT:  Head:    Genitourinary:     Musculoskeletal:  Hands normal to inspection - palpation  Psychiatric: He has a normal mood and affect. His behavior is normal. Judgment and thought content normal.   BP 128/58  Pulse 63  Temp(Src) 98.1 F (36.7 C) (Oral)  Wt 172 lb 6 oz (78.189 kg)  SpO2 99%      Assessment & Plan:     Recommend to come back for a physical at his earliest convenience

## 2013-10-09 NOTE — Progress Notes (Signed)
Pre visit review using our clinic review tool, if applicable. No additional management support is needed unless otherwise documented below in the visit note. 

## 2013-10-11 DIAGNOSIS — L989 Disorder of the skin and subcutaneous tissue, unspecified: Secondary | ICD-10-CM | POA: Insufficient documentation

## 2013-10-11 DIAGNOSIS — M720 Palmar fascial fibromatosis [Dupuytren]: Secondary | ICD-10-CM | POA: Insufficient documentation

## 2013-10-11 DIAGNOSIS — N529 Male erectile dysfunction, unspecified: Secondary | ICD-10-CM | POA: Insufficient documentation

## 2013-10-11 NOTE — Assessment & Plan Note (Signed)
Skin lesion, right face. Refer to dermatology  Bilateral rash, eczema versus rosacea. Plan: Start Clindagel, we'll see dermatology.

## 2013-10-11 NOTE — Assessment & Plan Note (Signed)
Dupuytren's, Penile symptoms likely related to Dupuytren's, hands are normal. To confirm this clinical diagnosis I recommend a urology referral, due to some insurance issues prefers to wait for now.

## 2013-10-11 NOTE — Assessment & Plan Note (Signed)
ED, refill Cialis, has been using it for a while, denies any penile pain or prolonged collections.

## 2013-11-17 ENCOUNTER — Telehealth: Payer: Self-pay | Admitting: Internal Medicine

## 2013-11-17 DIAGNOSIS — M720 Palmar fascial fibromatosis [Dupuytren]: Secondary | ICD-10-CM

## 2013-11-17 NOTE — Telephone Encounter (Signed)
Referral placed to urology.

## 2013-11-17 NOTE — Telephone Encounter (Signed)
Please advise. I do not see where referral was ever placed to Urology.

## 2013-11-17 NOTE — Telephone Encounter (Signed)
Pt states dr. Larose Kells was going to refer him to a urologist but he has not heard anything, pt is following up, pt has bcbs.

## 2013-11-17 NOTE — Telephone Encounter (Signed)
I did recommend a urology referral but he said he liked to wait.  If he is ready to see a urologist, please enter the referral, DX Dupuytren's

## 2013-12-07 ENCOUNTER — Ambulatory Visit (AMBULATORY_SURGERY_CENTER): Payer: Self-pay | Admitting: *Deleted

## 2013-12-07 VITALS — Ht 67.0 in | Wt 176.0 lb

## 2013-12-07 DIAGNOSIS — Z86010 Personal history of colonic polyps: Secondary | ICD-10-CM

## 2013-12-07 MED ORDER — MOVIPREP 100 G PO SOLR: ORAL | Status: DC

## 2013-12-07 NOTE — Progress Notes (Signed)
Patient denies any allergies to eggs or soy. Patient denies any problems with anesthesia/sedation. Patient denies any oxygen use at home and does not take any diet/weight loss medications. EMMI education assisgned to patient on colonoscopy, this was explained and instructions given to patient. 

## 2013-12-08 LAB — PSA: PSA: 1.02

## 2013-12-21 ENCOUNTER — Encounter: Payer: BC Managed Care – PPO | Admitting: Internal Medicine

## 2013-12-21 ENCOUNTER — Ambulatory Visit (AMBULATORY_SURGERY_CENTER): Payer: BC Managed Care – PPO | Admitting: Internal Medicine

## 2013-12-21 ENCOUNTER — Encounter: Payer: Self-pay | Admitting: Internal Medicine

## 2013-12-21 VITALS — BP 132/82 | HR 56 | Temp 97.1°F | Resp 33 | Ht 67.0 in | Wt 176.0 lb

## 2013-12-21 DIAGNOSIS — D125 Benign neoplasm of sigmoid colon: Secondary | ICD-10-CM

## 2013-12-21 DIAGNOSIS — D123 Benign neoplasm of transverse colon: Secondary | ICD-10-CM

## 2013-12-21 DIAGNOSIS — D122 Benign neoplasm of ascending colon: Secondary | ICD-10-CM

## 2013-12-21 DIAGNOSIS — Z86010 Personal history of colonic polyps: Secondary | ICD-10-CM

## 2013-12-21 DIAGNOSIS — D124 Benign neoplasm of descending colon: Secondary | ICD-10-CM

## 2013-12-21 DIAGNOSIS — K635 Polyp of colon: Secondary | ICD-10-CM

## 2013-12-21 MED ORDER — SODIUM CHLORIDE 0.9 % IV SOLN: 500.0000 mL | INTRAVENOUS | Status: DC

## 2013-12-21 NOTE — Progress Notes (Signed)
Called to room to assist during endoscopic procedure.  Patient ID and intended procedure confirmed with present staff. Received instructions for my participation in the procedure from the performing physician.  

## 2013-12-21 NOTE — Patient Instructions (Signed)
Discharge instructions given. Handout on polyps. Resume previous medications. YOU HAD AN ENDOSCOPIC PROCEDURE TODAY AT THE [facility]: Refer to the procedure report that was given to you for any specific questions about what was found during the examination.  If the procedure report does not answer your questions, please call your gastroenterologist to clarify.  If you requested that your care partner not be given the details of your procedure findings, then the procedure report has been included in a sealed envelope for you to review at your convenience later.  YOU SHOULD EXPECT: Some feelings of bloating in the abdomen. Passage of more gas than usual.  Walking can help get rid of the air that was put into your GI tract during the procedure and reduce the bloating. If you had a lower endoscopy (such as a colonoscopy or flexible sigmoidoscopy) you may notice spotting of blood in your stool or on the toilet paper. If you underwent a bowel prep for your procedure, then you may not have a normal bowel movement for a few days.  DIET: Your first meal following the procedure should be a light meal and then it is ok to progress to your normal diet.  A half-sandwich or bowl of soup is an example of a good first meal.  Heavy or fried foods are harder to digest and may make you feel nauseous or bloated.  Likewise meals heavy in dairy and vegetables can cause extra gas to form and this can also increase the bloating.  Drink plenty of fluids but you should avoid alcoholic beverages for 24 hours.  ACTIVITY: Your care partner should take you home directly after the procedure.  You should plan to take it easy, moving slowly for the rest of the day.  You can resume normal activity the day after the procedure however you should NOT DRIVE or use heavy machinery for 24 hours (because of the sedation medicines used during the test).    SYMPTOMS TO REPORT IMMEDIATELY: A gastroenterologist can be reached at any  hour.  During normal business hours, 8:30 AM to 5:00 PM Monday through Friday, call [phone].  After hours and on weekends, please call the GI answering service at [phone] who will take a message and have the physician on call contact you.   Following lower endoscopy (colonoscopy or flexible sigmoidoscopy):  Excessive amounts of blood in the stool  Significant tenderness or worsening of abdominal pains  Swelling of the abdomen that is new, acute  Fever of 100F or higher  FOLLOW UP: If any biopsies were taken you will be contacted by phone or by letter within the next 1-3 weeks.  Call your gastroenterologist if you have not heard about the biopsies in 3 weeks.  Our staff will call the home number listed on your records the next business day following your procedure to check on you and address any questions or concerns that you may have at that time regarding the information given to you following your procedure. This is a courtesy call and so if there is no answer at the home number and we have not heard from you through the emergency physician on call, we will assume that you have returned to your regular daily activities without incident.  SIGNATURES/CONFIDENTIALITY: You and/or your care partner have signed paperwork which will be entered into your electronic medical record.  These signatures attest to the fact that that the information above on your After Visit Summary has been reviewed and is   understood.  Full responsibility of the confidentiality of this discharge information lies with you and/or your care-partner. 

## 2013-12-21 NOTE — Op Note (Signed)
[facility]  [address] [place], [id number]   COLONOSCOPY PROCEDURE REPORT  PATIENT: John Harper, John Harper  MR#: [id number] BIRTHDATE: [birth year] , 53  yrs. old GENDER: male ENDOSCOPIST: Jerene Bears, MD PROCEDURE DATE:  12/21/2013 PROCEDURE:   Colonoscopy with cold biopsy polypectomy First Screening Colonoscopy - Avg.  risk and is 50 yrs.  old or older - No.  Prior Negative Screening - Now for repeat screening. N/A  History of Adenoma - Now for follow-up colonoscopy & has been > or = to 3 yrs.  Yes hx of adenoma.  Has been 3 or more years since last colonoscopy.  Polyps Removed Today? Yes. ASA CLASS:   Class II INDICATIONS:surveillance colonoscopy based on a history of adenomatous colonic polyp(s). MEDICATIONS: Monitored anesthesia care and Propofol 300 mg IV  DESCRIPTION OF PROCEDURE:   After the risks benefits and alternatives of the procedure were thoroughly explained, informed consent was obtained.  The digital rectal exam revealed several skin tags.   The LB CF-H180AL Loaner [id number]  endoscope was introduced through the anus and advanced to the cecum, which was identified by both the appendix and ileocecal valve. No adverse events experienced.   The quality of the prep was good, using MoviPrep  The instrument was then slowly withdrawn as the colon was fully examined.   COLON FINDINGS: Five sessile polyps ranging between 3-71mm in size were found in the ascending colon, transverse colon, descending colon, and sigmoid colon.  Polypectomies were performed with cold forceps.  The resection was complete, the polyp tissue was completely retrieved and sent to histology.  Retroflexed views revealed internal hemorrhoids and skin tags. The time to cecum=3 minutes 40 seconds.  Withdrawal time=11 minutes 55 seconds.  The scope was withdrawn and the procedure completed. COMPLICATIONS: There were no immediate complications.  ENDOSCOPIC IMPRESSION: Five sessile polyps  ranging between 3-52mm in size were found in the ascending colon, transverse colon, descending colon, and sigmoid colon; polypectomies were performed with cold forceps  RECOMMENDATIONS: 1.  Await pathology results 2.  Timing of repeat colonoscopy will be determined by pathology findings. 3.  You will receive a letter within 1-2 weeks with the results of your biopsy as well as final recommendations.  Please call my office if you have not received a letter after 3 weeks.  eSigned:  Jerene Bears, MD 12/21/2013 9:56 AM   cc: Kathlene November, MD and The Patient

## 2013-12-21 NOTE — Progress Notes (Signed)
A/ox3 pleased with MAC, report to Celia RN 

## 2013-12-22 ENCOUNTER — Telehealth: Payer: Self-pay | Admitting: *Deleted

## 2013-12-22 NOTE — Telephone Encounter (Signed)
  Follow up Call-  Call back number 12/21/2013  Post procedure Call Back phone  # [phone]  Permission to leave phone message Yes     Patient questions:  Do you have a fever, pain , or abdominal swelling? No. Pain Score  0 *  Have you tolerated food without any problems? Yes.    Have you been able to return to your normal activities? Yes.    Do you have any questions about your discharge instructions: Diet   No. Medications  No. Follow up visit  No.  Do you have questions or concerns about your Care? No.  Actions: * If pain score is 4 or above: No action needed, pain <4.

## 2013-12-28 ENCOUNTER — Encounter: Payer: Self-pay | Admitting: Internal Medicine

## 2014-01-12 ENCOUNTER — Encounter: Payer: Self-pay | Admitting: Internal Medicine

## 2014-09-16 ENCOUNTER — Other Ambulatory Visit: Payer: Self-pay

## 2014-11-19 ENCOUNTER — Encounter: Payer: Self-pay | Admitting: Internal Medicine

## 2014-11-19 ENCOUNTER — Ambulatory Visit (INDEPENDENT_AMBULATORY_CARE_PROVIDER_SITE_OTHER): Payer: BLUE CROSS/BLUE SHIELD | Admitting: Internal Medicine

## 2014-11-19 VITALS — BP 122/74 | HR 67 | Temp 97.8°F | Ht 67.0 in | Wt 170.2 lb

## 2014-11-19 DIAGNOSIS — Z0000 Encounter for general adult medical examination without abnormal findings: Secondary | ICD-10-CM | POA: Diagnosis not present

## 2014-11-19 DIAGNOSIS — Z23 Encounter for immunization: Secondary | ICD-10-CM

## 2014-11-19 DIAGNOSIS — Z09 Encounter for follow-up examination after completed treatment for conditions other than malignant neoplasm: Secondary | ICD-10-CM

## 2014-11-19 LAB — CBC WITH DIFFERENTIAL/PLATELET
Basophils Absolute: 0 10*3/uL (ref 0.0–0.1)
Basophils Relative: 0.4 % (ref 0.0–3.0)
Eosinophils Absolute: 0.4 10*3/uL (ref 0.0–0.7)
Eosinophils Relative: 4.2 % (ref 0.0–5.0)
HCT: 45.6 % (ref 39.0–52.0)
Hemoglobin: 15 g/dL (ref 13.0–17.0)
Lymphocytes Relative: 17.2 % (ref 12.0–46.0)
Lymphs Abs: 1.5 10*3/uL (ref 0.7–4.0)
MCHC: 32.8 g/dL (ref 30.0–36.0)
MCV: 94.7 fl (ref 78.0–100.0)
Monocytes Absolute: 0.5 10*3/uL (ref 0.1–1.0)
Monocytes Relative: 6.3 % (ref 3.0–12.0)
Neutro Abs: 6.1 10*3/uL (ref 1.4–7.7)
Neutrophils Relative %: 71.9 % (ref 43.0–77.0)
Platelets: 209 10*3/uL (ref 150.0–400.0)
RBC: 4.82 Mil/uL (ref 4.22–5.81)
RDW: 14.1 % (ref 11.5–15.5)
WBC: 8.5 10*3/uL (ref 4.0–10.5)

## 2014-11-19 LAB — COMPREHENSIVE METABOLIC PANEL
ALT: 17 U/L (ref 0–53)
AST: 27 U/L (ref 0–37)
Albumin: 4 g/dL (ref 3.5–5.2)
Alkaline Phosphatase: 49 U/L (ref 39–117)
BUN: 14 mg/dL (ref 6–23)
CO2: 31 mEq/L (ref 19–32)
Calcium: 9 mg/dL (ref 8.4–10.5)
Chloride: 104 mEq/L (ref 96–112)
Creatinine, Ser: 1.02 mg/dL (ref 0.40–1.50)
GFR: 80.65 mL/min (ref >60.00)
Glucose, Bld: 97 mg/dL (ref 70–99)
Potassium: 3.9 mEq/L (ref 3.5–5.1)
Sodium: 140 mEq/L (ref 135–145)
Total Bilirubin: 0.5 mg/dL (ref 0.2–1.2)
Total Protein: 6.3 g/dL (ref 6.0–8.3)

## 2014-11-19 LAB — LIPID PANEL
Cholesterol: 162 mg/dL (ref 0–200)
HDL: 63.3 mg/dL (ref >39.00)
LDL Cholesterol: 81 mg/dL (ref 0–99)
NonHDL: 98.82
Total CHOL/HDL Ratio: 3
Triglycerides: 91 mg/dL (ref 0.0–149.0)
VLDL: 18.2 mg/dL (ref 0.0–40.0)

## 2014-11-19 LAB — TSH: TSH: 0.83 u[IU]/mL (ref 0.35–4.50)

## 2014-11-19 NOTE — Assessment & Plan Note (Addendum)
Td 07 Flu shot today  +FH prostate ca, father, age 39s +FH CAD father , age 65s------ pt himself is asx EKG: Normal sinus rhythm, unchanged from 2011 CCS: cscope #2---12-2013, next 3 years Prostate cancer screening: Had a DRE at urology 12-2013. Exercise: Has a excellent lifestyle.  Diet- discussed Hep C, HIV, CMP, CBC, TSH, EKG

## 2014-11-19 NOTE — Progress Notes (Signed)
Subjective:    Patient ID: John Harper, male    DOB: Oct 14, 1960, 54 y.o.   MRN: [id number]  DOS:  11/19/2014 Type of visit - description : CPX Interval history: No major concerns    Review of Systems  Constitutional: No fever. No chills. No unexplained wt changes. No unusual sweats  HEENT: No dental problems, no ear discharge, no facial swelling, no voice changes. No eye discharge, no eye  redness , no  intolerance to light   Respiratory: No wheezing , no  difficulty breathing. No cough , no mucus production  Cardiovascular: No CP, no leg swelling , no  Palpitations  GI: no nausea, no vomiting, no diarrhea , no  abdominal pain.  No blood in the stools. No dysphagia, no odynophagia    Endocrine: No polyphagia, no polyuria , no polydipsia  GU: No dysuria, gross hematuria, difficulty urinating. No urinary urgency, no frequency.  Musculoskeletal: No joint swellings or unusual aches or pains  Skin: No change in the color of the skin, palor , no  Rash  Allergic, immunologic: No environmental allergies , no  food allergies  Neurological: No dizziness no  syncope. No headaches. No diplopia, no slurred, no slurred speech, no motor deficits, no facial  Numbness  Hematological: No enlarged lymph nodes, no easy bruising , no unusual bleedings  Psychiatry: No suicidal ideas, no hallucinations, no beavior problems, no confusion.  No unusual/severe anxiety, no depression  Past Medical History  Diagnosis Date  . Hemorrhoids   . Anal fissure   . Viral meningitis 2013    had spinal tap.  Marland Kitchen BPH (benign prostatic hyperplasia)     w/o LUTS  . Peyronie's disease   . Erectile dysfunction due to arterial insufficiency     Past Surgical History  Procedure Laterality Date  . Colonoscopy    . Polypectomy      Social History   Social History  . Marital Status: Single    Spouse Name: N/A  . Number of Children: 2  . Years of Education: N/A   Occupational History  . sales rep     Social History Main Topics  . Smoking status: Former Research scientist (life sciences)  . Smokeless tobacco: Never Used     Comment: quit 2010  . Alcohol Use: 18.6 oz/week    14 Cans of beer, 2 Shots of liquor, 15 Standard drinks or equivalent per week     Comment: ~17 drinks/week  . Drug Use: No  . Sexual Activity: Not on file   Other Topics Concern  . Not on file   Social History Narrative   Lives by himself   Twins, boy-girl        Medication List       This list is accurate as of: 11/19/14 11:59 PM.  Always use your most recent med list.               aspirin 81 MG tablet  Take 81 mg by mouth daily.     hydrocortisone 2.5 % cream  As directed.     ketoconazole 2 % cream  Commonly known as:  NIZORAL  Apply as directed     tadalafil 20 MG tablet  Commonly known as:  CIALIS  Take 20 mg by mouth daily as needed for erectile dysfunction.       Family History  Problem Relation Age of Onset  . Pancreatic cancer Father   . Coronary artery disease Father 56    CABG x2  .  Cancer Father 30    kidney...prostate  . Prostate cancer Father   . Bladder Cancer Father   . Colon cancer Neg Hx   . Stomach cancer Neg Hx   . Esophageal cancer Neg Hx   . Rectal cancer Neg Hx   . Stroke Mother   . Hypertension Brother   . Diabetes Other     aunts and uncles       Objective:   Physical Exam BP 122/74 mmHg  Pulse 67  Temp(Src) 97.8 F (36.6 C) (Oral)  Ht 5\' 7"  (1.702 m)  Wt 170 lb 4 oz (77.225 kg)  BMI 26.66 kg/m2  SpO2 97% General:   Well developed, well nourished . NAD.  HEENT:  Normocephalic . Face symmetric, atraumatic Neck: No thyromegaly Lungs:  CTA B Normal respiratory effort, no intercostal retractions, no accessory muscle use. Heart: RRR,  no murmur.  no pretibial edema bilaterally  Abdomen:  Not distended, soft, non-tender. No rebound or rigidity. No mass,organomegaly Skin: Not pale. Not jaundice Neurologic:  alert & oriented X3.  Speech normal, gait  appropriate for age and unassisted Psych--  Cognition and judgment appear intact.  Cooperative with normal attention span and concentration.  Behavior appropriate. No anxious or depressed appearing.    Assessment & Plan:   Assessment> BPH Rosacea, has seen derm, failed soolantra Peroyne's disease, sees urology Erectile dysfunction Viral meningitis 2013, no sequela  +FH prostate ca, father, age 51s +FH CAD father , age 7s  PLAN: Chronic medical problems: Follow-up elsewhere. He seems to be doing well

## 2014-11-19 NOTE — Patient Instructions (Addendum)
Get your blood work before you leave     Next visit  for a physical exam in one year, fasting     Please schedule an appointment at the front desk  

## 2014-11-19 NOTE — Progress Notes (Signed)
Pre visit review using our clinic review tool, if applicable. No additional management support is needed unless otherwise documented below in the visit note. 

## 2014-11-20 LAB — HIV ANTIBODY (ROUTINE TESTING W REFLEX): HIV 1&2 Ab, 4th Generation: NONREACTIVE

## 2014-11-20 LAB — HEPATITIS C ANTIBODY: HCV Ab: NEGATIVE

## 2014-11-21 DIAGNOSIS — Z09 Encounter for follow-up examination after completed treatment for conditions other than malignant neoplasm: Secondary | ICD-10-CM | POA: Insufficient documentation

## 2014-11-27 ENCOUNTER — Emergency Department (HOSPITAL_COMMUNITY): Payer: BLUE CROSS/BLUE SHIELD

## 2014-11-27 ENCOUNTER — Encounter (HOSPITAL_COMMUNITY): Payer: Self-pay

## 2014-11-27 ENCOUNTER — Inpatient Hospital Stay (HOSPITAL_COMMUNITY)
Admission: EM | Admit: 2014-11-27 | Discharge: 2014-11-28 | DRG: 076 | Disposition: A | Discharge status: Home / Self Care | Payer: BLUE CROSS/BLUE SHIELD | Attending: Internal Medicine | Admitting: Internal Medicine

## 2014-11-27 DIAGNOSIS — Z8042 Family history of malignant neoplasm of prostate: Secondary | ICD-10-CM | POA: Diagnosis not present

## 2014-11-27 DIAGNOSIS — Z87891 Personal history of nicotine dependence: Secondary | ICD-10-CM

## 2014-11-27 DIAGNOSIS — D72820 Lymphocytosis (symptomatic): Secondary | ICD-10-CM | POA: Diagnosis present

## 2014-11-27 DIAGNOSIS — Z8661 Personal history of infections of the central nervous system: Secondary | ICD-10-CM

## 2014-11-27 DIAGNOSIS — G4459 Other complicated headache syndrome: Secondary | ICD-10-CM | POA: Diagnosis not present

## 2014-11-27 DIAGNOSIS — Z833 Family history of diabetes mellitus: Secondary | ICD-10-CM

## 2014-11-27 DIAGNOSIS — Z7982 Long term (current) use of aspirin: Secondary | ICD-10-CM

## 2014-11-27 DIAGNOSIS — N400 Enlarged prostate without lower urinary tract symptoms: Secondary | ICD-10-CM | POA: Diagnosis present

## 2014-11-27 DIAGNOSIS — G032 Benign recurrent meningitis [Mollaret]: Secondary | ICD-10-CM | POA: Insufficient documentation

## 2014-11-27 DIAGNOSIS — Z8052 Family history of malignant neoplasm of bladder: Secondary | ICD-10-CM | POA: Diagnosis not present

## 2014-11-27 DIAGNOSIS — N486 Induration penis plastica: Secondary | ICD-10-CM | POA: Diagnosis present

## 2014-11-27 DIAGNOSIS — Z113 Encounter for screening for infections with a predominantly sexual mode of transmission: Secondary | ICD-10-CM | POA: Insufficient documentation

## 2014-11-27 DIAGNOSIS — D72829 Elevated white blood cell count, unspecified: Secondary | ICD-10-CM | POA: Diagnosis present

## 2014-11-27 DIAGNOSIS — Z8249 Family history of ischemic heart disease and other diseases of the circulatory system: Secondary | ICD-10-CM | POA: Diagnosis not present

## 2014-11-27 DIAGNOSIS — Z8051 Family history of malignant neoplasm of kidney: Secondary | ICD-10-CM | POA: Diagnosis not present

## 2014-11-27 DIAGNOSIS — Z79899 Other long term (current) drug therapy: Secondary | ICD-10-CM

## 2014-11-27 DIAGNOSIS — Z9889 Other specified postprocedural states: Secondary | ICD-10-CM

## 2014-11-27 DIAGNOSIS — B003 Herpesviral meningitis: Secondary | ICD-10-CM | POA: Diagnosis not present

## 2014-11-27 DIAGNOSIS — G030 Nonpyogenic meningitis: Secondary | ICD-10-CM | POA: Diagnosis not present

## 2014-11-27 DIAGNOSIS — A879 Viral meningitis, unspecified: Secondary | ICD-10-CM

## 2014-11-27 DIAGNOSIS — Z823 Family history of stroke: Secondary | ICD-10-CM

## 2014-11-27 DIAGNOSIS — R519 Headache, unspecified: Secondary | ICD-10-CM | POA: Diagnosis present

## 2014-11-27 DIAGNOSIS — Z7251 High risk heterosexual behavior: Secondary | ICD-10-CM | POA: Diagnosis not present

## 2014-11-27 DIAGNOSIS — R51 Headache: Secondary | ICD-10-CM

## 2014-11-27 LAB — PROCALCITONIN: Procalcitonin: <0.1 ng/mL

## 2014-11-27 LAB — CBC WITH DIFFERENTIAL/PLATELET
Basophils Absolute: 0 10*3/uL (ref 0.0–0.1)
Basophils Relative: 0 %
Eosinophils Absolute: 0 10*3/uL (ref 0.0–0.7)
Eosinophils Relative: 0 %
HCT: 43.9 % (ref 39.0–52.0)
Hemoglobin: 14.6 g/dL (ref 13.0–17.0)
Lymphocytes Relative: 6 %
Lymphs Abs: 0.8 10*3/uL (ref 0.7–4.0)
MCH: 31.4 pg (ref 26.0–34.0)
MCHC: 33.3 g/dL (ref 30.0–36.0)
MCV: 94.4 fL (ref 78.0–100.0)
Monocytes Absolute: 0.8 10*3/uL (ref 0.1–1.0)
Monocytes Relative: 5 %
Neutro Abs: 12.5 10*3/uL — ABNORMAL HIGH (ref 1.7–7.7)
Neutrophils Relative %: 89 %
Platelets: 211 10*3/uL (ref 150–400)
RBC: 4.65 MIL/uL (ref 4.22–5.81)
RDW: 13.1 % (ref 11.5–15.5)
WBC: 14 10*3/uL — ABNORMAL HIGH (ref 4.0–10.5)

## 2014-11-27 LAB — COMPREHENSIVE METABOLIC PANEL
ALT: 35 U/L (ref 17–63)
AST: 23 U/L (ref 15–41)
Albumin: 4.2 g/dL (ref 3.5–5.0)
Alkaline Phosphatase: 51 U/L (ref 38–126)
Anion gap: 8 (ref 5–15)
BUN: 14 mg/dL (ref 6–20)
CO2: 25 mmol/L (ref 22–32)
Calcium: 8.9 mg/dL (ref 8.9–10.3)
Chloride: 105 mmol/L (ref 101–111)
Creatinine, Ser: 0.96 mg/dL (ref 0.61–1.24)
GFR calc Af Amer: >60 mL/min (ref >60)
GFR calc non Af Amer: >60 mL/min (ref >60)
Glucose, Bld: 129 mg/dL — ABNORMAL HIGH (ref 65–99)
Potassium: 3.9 mmol/L (ref 3.5–5.1)
Sodium: 138 mmol/L (ref 135–145)
Total Bilirubin: 0.8 mg/dL (ref 0.3–1.2)
Total Protein: 6.8 g/dL (ref 6.5–8.1)

## 2014-11-27 LAB — CSF CELL COUNT WITH DIFFERENTIAL
Eosinophils, CSF: 0 % (ref 0–1)
Eosinophils, CSF: 0 % (ref 0–1)
Lymphs, CSF: 85 % — ABNORMAL HIGH (ref 40–80)
Lymphs, CSF: 86 % — ABNORMAL HIGH (ref 40–80)
Monocyte-Macrophage-Spinal Fluid: 10 % — ABNORMAL LOW (ref 15–45)
Monocyte-Macrophage-Spinal Fluid: 12 % — ABNORMAL LOW (ref 15–45)
RBC Count, CSF: 30 /mm3 — ABNORMAL HIGH
RBC Count, CSF: 35 /mm3 — ABNORMAL HIGH
Segmented Neutrophils-CSF: 3 % (ref 0–6)
Segmented Neutrophils-CSF: 4 % (ref 0–6)
Tube #: 1
Tube #: 4
WBC, CSF: 238 /mm3 (ref 0–5)
WBC, CSF: 298 /mm3 (ref 0–5)

## 2014-11-27 LAB — URINALYSIS, ROUTINE W REFLEX MICROSCOPIC
Bilirubin Urine: NEGATIVE
Glucose, UA: NEGATIVE mg/dL
Hgb urine dipstick: NEGATIVE
Ketones, ur: NEGATIVE mg/dL
Leukocytes, UA: NEGATIVE
Nitrite: NEGATIVE
Protein, ur: NEGATIVE mg/dL
Specific Gravity, Urine: 1.028 (ref 1.005–1.030)
pH: 6.5 (ref 5.0–8.0)

## 2014-11-27 LAB — GLUCOSE, CSF: Glucose, CSF: 58 mg/dL (ref 40–70)

## 2014-11-27 LAB — MAGNESIUM: Magnesium: 1.8 mg/dL (ref 1.7–2.4)

## 2014-11-27 LAB — PROTEIN, CSF: Total  Protein, CSF: 162 mg/dL — ABNORMAL HIGH (ref 15–45)

## 2014-11-27 MED ORDER — ONDANSETRON HCL 4 MG/2ML IJ SOLN
4.0000 mg | Freq: Once | INTRAMUSCULAR | Status: AC
  Administered 2014-11-27: 4 mg via INTRAVENOUS
  Filled 2014-11-27: qty 2

## 2014-11-27 MED ORDER — ONDANSETRON HCL 4 MG PO TABS: 4.0000 mg | ORAL_TABLET | Freq: Four times a day (QID) | ORAL | Status: DC | PRN

## 2014-11-27 MED ORDER — ACETAMINOPHEN 325 MG PO TABS: 650.0000 mg | ORAL_TABLET | Freq: Four times a day (QID) | ORAL | Status: DC | PRN

## 2014-11-27 MED ORDER — INFLUENZA VAC SPLIT QUAD 0.5 ML IM SUSY
0.5000 mL | PREFILLED_SYRINGE | Freq: Once | INTRAMUSCULAR | Status: DC
  Filled 2014-11-27 (×2): qty 0.5

## 2014-11-27 MED ORDER — KETOROLAC TROMETHAMINE 30 MG/ML IJ SOLN
30.0000 mg | Freq: Four times a day (QID) | INTRAMUSCULAR | Status: DC | PRN
  Administered 2014-11-27 – 2014-11-28 (×2): 30 mg via INTRAVENOUS
  Filled 2014-11-27 (×2): qty 1

## 2014-11-27 MED ORDER — MORPHINE SULFATE (PF) 4 MG/ML IV SOLN
4.0000 mg | Freq: Once | INTRAVENOUS | Status: AC
  Administered 2014-11-27: 4 mg via INTRAVENOUS
  Filled 2014-11-27: qty 1

## 2014-11-27 MED ORDER — FLEET ENEMA 7-19 GM/118ML RE ENEM: 1.0000 | ENEMA | Freq: Once | RECTAL | Status: DC | PRN

## 2014-11-27 MED ORDER — OXYCODONE HCL 5 MG PO TABS: 5.0000 mg | ORAL_TABLET | ORAL | Status: DC | PRN

## 2014-11-27 MED ORDER — ACETAMINOPHEN 650 MG RE SUPP: 650.0000 mg | Freq: Four times a day (QID) | RECTAL | Status: DC | PRN

## 2014-11-27 MED ORDER — ACYCLOVIR SODIUM 50 MG/ML IV SOLN
10.0000 mg/kg | Freq: Once | INTRAVENOUS | Status: AC
  Administered 2014-11-27: 770 mg via INTRAVENOUS
  Filled 2014-11-27: qty 15.4

## 2014-11-27 MED ORDER — SODIUM CHLORIDE 0.9 % IV BOLUS (SEPSIS)
1000.0000 mL | Freq: Once | INTRAVENOUS | Status: AC
  Administered 2014-11-27: 1000 mL via INTRAVENOUS

## 2014-11-27 MED ORDER — PROCHLORPERAZINE EDISYLATE 5 MG/ML IJ SOLN
10.0000 mg | Freq: Four times a day (QID) | INTRAMUSCULAR | Status: DC | PRN
Start: 2014-11-27 — End: 2014-11-28
  Administered 2014-11-27: 10 mg via INTRAVENOUS
  Filled 2014-11-27: qty 2

## 2014-11-27 MED ORDER — SODIUM CHLORIDE 0.9 % IV SOLN
INTRAVENOUS | Status: DC
  Administered 2014-11-27 – 2014-11-28 (×2): via INTRAVENOUS

## 2014-11-27 MED ORDER — ONDANSETRON HCL 4 MG/2ML IJ SOLN: 4.0000 mg | Freq: Four times a day (QID) | INTRAMUSCULAR | Status: DC | PRN

## 2014-11-27 MED ORDER — ALBUTEROL SULFATE (2.5 MG/3ML) 0.083% IN NEBU: 2.5000 mg | INHALATION_SOLUTION | RESPIRATORY_TRACT | Status: DC | PRN

## 2014-11-27 MED ORDER — SORBITOL 70 % SOLN: 30.0000 mL | Freq: Every day | Status: DC | PRN

## 2014-11-27 MED ORDER — ACYCLOVIR SODIUM 50 MG/ML IV SOLN
10.0000 mg/kg | Freq: Three times a day (TID) | INTRAVENOUS | Status: DC
  Administered 2014-11-28 (×2): 770 mg via INTRAVENOUS
  Filled 2014-11-27 (×3): qty 15.4

## 2014-11-27 MED ORDER — MORPHINE SULFATE (PF) 4 MG/ML IV SOLN
4.0000 mg | Freq: Once | INTRAVENOUS | Status: DC
  Filled 2014-11-27: qty 1

## 2014-11-27 MED ORDER — POLYETHYLENE GLYCOL 3350 17 G PO PACK: 17.0000 g | PACK | Freq: Every day | ORAL | Status: DC | PRN

## 2014-11-27 MED ORDER — ASPIRIN 81 MG PO CHEW
81.0000 mg | CHEWABLE_TABLET | Freq: Every day | ORAL | Status: DC
  Administered 2014-11-28: 81 mg via ORAL
  Filled 2014-11-27: qty 1

## 2014-11-27 MED ORDER — DOCUSATE SODIUM 100 MG PO CAPS
100.0000 mg | ORAL_CAPSULE | Freq: Two times a day (BID) | ORAL | Status: DC
  Administered 2014-11-27 – 2014-11-28 (×2): 100 mg via ORAL
  Filled 2014-11-27 (×2): qty 1

## 2014-11-27 MED ORDER — SODIUM CHLORIDE 0.9 % IV BOLUS (SEPSIS): 1000.0000 mL | Freq: Once | INTRAVENOUS | Status: DC

## 2014-11-27 MED ORDER — LIDOCAINE-EPINEPHRINE 2 %-1:100000 IJ SOLN
20.0000 mL | Freq: Once | INTRAMUSCULAR | Status: DC
  Filled 2014-11-27: qty 1

## 2014-11-27 MED ORDER — ALUM & MAG HYDROXIDE-SIMETH 200-200-20 MG/5ML PO SUSP: 30.0000 mL | Freq: Four times a day (QID) | ORAL | Status: DC | PRN

## 2014-11-27 NOTE — ED Provider Notes (Addendum)
CSN: [id number]     Arrival date & time 11/27/14  [id number] History   First MD Initiated Contact with Patient 11/27/14 1010     Chief Complaint  Patient presents with  . Headache  . Weakness     (Consider location/radiation/quality/duration/timing/severity/associated sxs/prior Treatment) HPI.... Level V caveat for urgent need for intervention. Patient presents with severe headache, neck pain, weakness, lethargy, malaise since mid afternoon yesterday. Previous history of viral meningitis. He is ambulatory and able to drink fluids. He is normally quite healthy.  Past Medical History  Diagnosis Date  . Hemorrhoids   . Anal fissure   . Viral meningitis 2013    had spinal tap.  Marland Kitchen BPH (benign prostatic hyperplasia)     w/o LUTS  . Peyronie's disease   . Erectile dysfunction due to arterial insufficiency    Past Surgical History  Procedure Laterality Date  . Colonoscopy    . Polypectomy     Family History  Problem Relation Age of Onset  . Pancreatic cancer Father   . Coronary artery disease Father 7    CABG x2  . Cancer Father 68    kidney...prostate  . Prostate cancer Father   . Bladder Cancer Father   . Colon cancer Neg Hx   . Stomach cancer Neg Hx   . Esophageal cancer Neg Hx   . Rectal cancer Neg Hx   . Stroke Mother   . Hypertension Brother   . Diabetes Other     aunts and uncles   Social History  Substance Use Topics  . Smoking status: Former Research scientist (life sciences)  . Smokeless tobacco: Never Used     Comment: quit 2010  . Alcohol Use: 18.6 oz/week    14 Cans of beer, 2 Shots of liquor, 15 Standard drinks or equivalent per week     Comment: weekends    Review of Systems  Reason unable to perform ROS: Urgent need for intervention.      Allergies  Review of patient's allergies indicates no known allergies.  Home Medications   Prior to Admission medications   Medication Sig Start Date End Date Taking? Authorizing Provider  aspirin 81 MG tablet Take 81 mg by mouth daily.      Yes Historical Provider, MD  hydrocortisone 2.5 % cream As directed. 10/07/14  Yes Historical Provider, MD  Influenza vac split quadrivalent PF (FLUARIX) 0.5 ML injection Inject 0.5 mLs into the muscle once.   Yes Historical Provider, MD  ketoconazole (NIZORAL) 2 % cream Apply as directed 10/07/14  Yes Historical Provider, MD  tadalafil (CIALIS) 20 MG tablet Take 20 mg by mouth daily as needed for erectile dysfunction.   Yes Historical Provider, MD   BP 139/75 mmHg  Pulse 94  Temp(Src) 99.5 F (37.5 C) (Oral)  Resp 14  SpO2 92% Physical Exam  Constitutional: He is oriented to person, place, and time.  Well-hydrated  HENT:  Head: Normocephalic and atraumatic.  Eyes: Conjunctivae and EOM are normal. Pupils are equal, round, and reactive to light.  Neck: Normal range of motion.  Tender to palpation posterior cervical spine especially with flexion.  Cardiovascular: Normal rate and regular rhythm.   Pulmonary/Chest: Effort normal and breath sounds normal.  Abdominal: Soft. Bowel sounds are normal.  Musculoskeletal: Normal range of motion.  Neurological: He is alert and oriented to person, place, and time.  Skin: Skin is warm and dry.  Psychiatric: He has a normal mood and affect. His behavior is normal.  Nursing note and vitals  reviewed.   ED Course  .Lumbar Puncture Date/Time: 11/27/2014 3:32 PM Performed by: Nat Christen Authorized by: Nat Christen Consent: Written consent obtained. Risks and benefits: risks, benefits and alternatives were discussed Consent given by: patient Patient understanding: patient states understanding of the procedure being performed Patient consent: the patient's understanding of the procedure matches consent given Procedure consent: procedure consent matches procedure scheduled Relevant documents: relevant documents present and verified Test results: test results available and properly labeled Site marked: the operative site was marked Imaging  studies: imaging studies available Patient identity confirmed: verbally with patient Indications: evaluation for infection Anesthesia: local infiltration Local anesthetic: lidocaine 1% without epinephrine Anesthetic total: 5 ml Patient sedated: no Preparation: Patient was prepped and draped in the usual sterile fashion. Lumbar space: L3-L4 interspace Patient's position: left lateral decubitus Needle gauge: 20 Needle type: spinal needle - Quincke tip Fluid appearance: clear Tubes of fluid: 4 Total volume: 8 ml Post-procedure: site cleaned and adhesive bandage applied Patient tolerance: Patient tolerated the procedure well with no immediate complications   (including critical care time) Labs Review Labs Reviewed  CBC WITH DIFFERENTIAL/PLATELET - Abnormal; Notable for the following:    WBC 14.0 (*)    Neutro Abs 12.5 (*)    All other components within normal limits  COMPREHENSIVE METABOLIC PANEL - Abnormal; Notable for the following:    Glucose, Bld 129 (*)    All other components within normal limits  CSF CELL COUNT WITH DIFFERENTIAL - Abnormal; Notable for the following:    RBC Count, CSF 35 (*)    WBC, CSF 238 (*)    Lymphs, CSF 86 (*)    Monocyte-Macrophage-Spinal Fluid 10 (*)    All other components within normal limits  CSF CELL COUNT WITH DIFFERENTIAL - Abnormal; Notable for the following:    RBC Count, CSF 30 (*)    WBC, CSF 298 (*)    Lymphs, CSF 85 (*)    Monocyte-Macrophage-Spinal Fluid 12 (*)    All other components within normal limits  PROTEIN, CSF - Abnormal; Notable for the following:    Total  Protein, CSF 162 (*)    All other components within normal limits  CSF CULTURE  URINALYSIS, ROUTINE W REFLEX MICROSCOPIC (NOT AT [facility])  GLUCOSE, CSF    Imaging Review Ct Head Wo Contrast  11/27/2014  CLINICAL DATA:  Patient reports a history of viral meniningitis 3 years ago. Patiaent reports that he had a headache yesterday and today the headache is 10/10,  slight stiffness in his neck and weakness. EXAM: CT HEAD WITHOUT CONTRAST TECHNIQUE: Contiguous axial images were obtained from the base of the skull through the vertex without intravenous contrast. COMPARISON:  None. FINDINGS: The ventricles are normal in size and configuration. There are no parenchymal masses or mass effect. There is no evidence of an infarct. There are no extra-axial masses or abnormal fluid collections. There is no intracranial hemorrhage. There is mild maxillary sinus mucosal thickening. Small mucous retention cysts and minor mucosal thickening is noted along the maxillary sinuses. Clear mastoid air cells. No skull lesion. IMPRESSION: 1. No intracranial abnormality. 2. Mild sinus mucosal thickening. Electronically Signed   By: Lajean Manes M.D.   On: 11/27/2014 11:51   I have personally reviewed and evaluated these images and lab results as part of my medical decision-making.   EKG Interpretation None     CRITICAL CARE Performed by: Nat Christen Total critical care time: 40 minutes Critical care time was exclusive of separately billable procedures and  treating other patients. Critical care was necessary to treat or prevent imminent or life-threatening deterioration. Critical care was time spent personally by me on the following activities: development of treatment plan with patient and/or surrogate as well as nursing, discussions with consultants, evaluation of patient's response to treatment, examination of patient, obtaining history from patient or surrogate, ordering and performing treatments and interventions, ordering and review of laboratory studies, ordering and review of radiographic studies, pulse oximetry and re-evaluation of patient's condition. MDM   Final diagnoses:  Viral meningitis    Patient is stable. History and physical and spinal fluid analysis consistent with viral meningitis. IV fluids. IV acyclovir 10 mg/kg, pain meds.  Admit to general medicine Dr  Grandville Silos.    Nat Christen, MD 11/27/14 [place], MD 11/27/14 [phone]

## 2014-11-27 NOTE — ED Notes (Signed)
He is drinking fluids and keeping them down just fine.

## 2014-11-27 NOTE — H&P (Signed)
[facility] History and Physical  John Harper [id number] DOB: [birth year] DOA: 11/27/2014  Referring physician: Dr Lacinda Axon PCP: Kathlene November, MD   Chief Complaint: Headache/Neck stiffness  HPI: John Harper is a 54 y.o. male  With prior history of viral meningitis in 11/02/2010, hemorrhoids, BPH, Peyronie's disease, ED secondary to arterial insufficiency who presents to ED with 1 day history of severe headache at the back of the head and behind her eyes, neck stiffness, chills, weakness, lethargy, malaise and diffuse myalgias with associated nausea and fatigue. Patient denies any fevers, no shortness of breath, no chest pain, no abdominal pain, no diarrhea, no constipation, no dysuria, no cough, no melena, no hematemesis, no hematochezia. Patient states recently got over a cold sore. Patient was seen in the emergency room CBC done had a white count of 14.0 with a left shift, compressive metabolic profile is unremarkable. Urinalysis was nitrite negative leukocyte esterase negative. CT head was negative. Patient underwent a lumbar puncture. ED physician which showed a RBC of 35 total protein of 162 glucose of 58. 86% lymphocytes WBC of 238 with 4 segmented neutrophils CSF fluid was clear colorless. Gram stain was sent. It was felt patient likely had a viral meningitis and patient was placed on IV acyclovir. [facility] were called to admit the patient for further evaluation and management.   Review of Systems: Per history of present illness otherwise negative. Constitutional:  No weight loss, night sweats, Fevers, chills, fatigue.  HEENT:  No headaches, Difficulty swallowing,Tooth/dental problems,Sore throat,  No sneezing, itching, ear ache, nasal congestion, post nasal drip,  Cardio-vascular:  No chest pain, Orthopnea, PND, swelling in lower extremities, anasarca, dizziness, palpitations  GI:  No heartburn, indigestion, abdominal pain, nausea, vomiting, diarrhea, change in  bowel habits, loss of appetite  Resp:  No shortness of breath with exertion or at rest. No excess mucus, no productive cough, No non-productive cough, No coughing up of blood.No change in color of mucus.No wheezing.No chest wall deformity  Skin:  no rash or lesions.  GU:  no dysuria, change in color of urine, no urgency or frequency. No flank pain.  Musculoskeletal:  No joint pain or swelling. No decreased range of motion. No back pain.  Psych:  No change in mood or affect. No depression or anxiety. No memory loss.   Past Medical History  Diagnosis Date  . Hemorrhoids   . Anal fissure   . Viral meningitis 2013    had spinal tap.  Marland Kitchen BPH (benign prostatic hyperplasia)     w/o LUTS  . Peyronie's disease   . Erectile dysfunction due to arterial insufficiency    Past Surgical History  Procedure Laterality Date  . Colonoscopy    . Polypectomy     Social History:  reports that he quit smoking about 4 weeks ago. His smoking use included Cigarettes. He has never used smokeless tobacco. He reports that he drinks about 18.6 oz of alcohol per week. He reports that he does not use illicit drugs.  No Known Allergies  Family History  Problem Relation Age of Onset  . Pancreatic cancer Father   . Coronary artery disease Father 65    CABG x2  . Cancer Father 25    kidney...prostate  . Prostate cancer Father   . Bladder Cancer Father   . Colon cancer Neg Hx   . Stomach cancer Neg Hx   . Esophageal cancer Neg Hx   . Rectal cancer Neg Hx   . Stroke Mother   .  Neuropathy Mother   . Hypertension Brother   . Diabetes Other     aunts and uncles   mother alive age 73 with a history of stroke neuropathy and arthritis. Father alive age 51 with a history of coronary artery disease status post CABG 2, prostate cancer, pancreatic cancer, bladder cancer. Patient is divorced, and is a Orthoptist.  Prior to Admission medications   Medication Sig Start Date End Date Taking? Authorizing  Provider  aspirin 81 MG tablet Take 81 mg by mouth daily.     Yes Historical Provider, MD  hydrocortisone 2.5 % cream As directed. 10/07/14  Yes Historical Provider, MD  Influenza vac split quadrivalent PF (FLUARIX) 0.5 ML injection Inject 0.5 mLs into the muscle once.   Yes Historical Provider, MD  ketoconazole (NIZORAL) 2 % cream Apply as directed 10/07/14  Yes Historical Provider, MD  tadalafil (CIALIS) 20 MG tablet Take 20 mg by mouth daily as needed for erectile dysfunction.   Yes Historical Provider, MD   Physical Exam: Filed Vitals:   11/27/14 1005 11/27/14 1212 11/27/14 1427  BP: 128/72 122/65 139/75  Pulse: 88 86 94  Temp: 98.3 F (36.8 C)  99.5 F (37.5 C)  TempSrc: Oral  Oral  Resp: 17 16 14   SpO2: 95% 93% 92%    Wt Readings from Last 3 Encounters:  11/19/14 77.225 kg (170 lb 4 oz)  12/21/13 79.833 kg (176 lb)  12/07/13 79.833 kg (176 lb)    General:  Well-developed well-nourished laying on gurney in no acute cardiopulmonary distress. Speaking in full sentences. Eyes: PERRLA, EOMI, normal lids, irises & conjunctiva ENT: grossly normal hearing, lips & tongue Neck: no LAD, masses or thyromegaly. Nuchal rigidity. Cardiovascular: RRR, no m/r/g. No LE edema. Respiratory: CTA bilaterally, no w/r/r. Normal respiratory effort. Abdomen: soft, ntnd, positive bowel sounds, no rebound, no guarding Skin: no rash or induration seen on limited exam Musculoskeletal: grossly normal tone BUE/BLE Psychiatric: grossly normal mood and affect, speech fluent and appropriate Neurologic: Alert and or to 3. Cranial nerves II through XII grossly intact. No focal deficits.           Labs on Admission:  Basic Metabolic Panel:  Recent Labs Lab 11/27/14 1105  NA 138  K 3.9  CL 105  CO2 25  GLUCOSE 129*  BUN 14  CREATININE 0.96  CALCIUM 8.9   Liver Function Tests:  Recent Labs Lab 11/27/14 1105  AST 23  ALT 35  ALKPHOS 51  BILITOT 0.8  PROT 6.8  ALBUMIN 4.2   No results  for input(s): LIPASE, AMYLASE in the last 168 hours. No results for input(s): AMMONIA in the last 168 hours. CBC:  Recent Labs Lab 11/27/14 1105  WBC 14.0*  NEUTROABS 12.5*  HGB 14.6  HCT 43.9  MCV 94.4  PLT 211   Cardiac Enzymes: No results for input(s): CKTOTAL, CKMB, CKMBINDEX, TROPONINI in the last 168 hours.  BNP (last 3 results) No results for input(s): BNP in the last 8760 hours.  ProBNP (last 3 results) No results for input(s): PROBNP in the last 8760 hours.  CBG: No results for input(s): GLUCAP in the last 168 hours.  Radiological Exams on Admission: Ct Head Wo Contrast  11/27/2014  CLINICAL DATA:  Patient reports a history of viral meniningitis 3 years ago. Patiaent reports that he had a headache yesterday and today the headache is 10/10, slight stiffness in his neck and weakness. EXAM: CT HEAD WITHOUT CONTRAST TECHNIQUE: Contiguous axial images were obtained from  the base of the skull through the vertex without intravenous contrast. COMPARISON:  None. FINDINGS: The ventricles are normal in size and configuration. There are no parenchymal masses or mass effect. There is no evidence of an infarct. There are no extra-axial masses or abnormal fluid collections. There is no intracranial hemorrhage. There is mild maxillary sinus mucosal thickening. Small mucous retention cysts and minor mucosal thickening is noted along the maxillary sinuses. Clear mastoid air cells. No skull lesion. IMPRESSION: 1. No intracranial abnormality. 2. Mild sinus mucosal thickening. Electronically Signed   By: Lajean Manes M.D.   On: 11/27/2014 11:51    EKG: None  Assessment/Plan Principal Problem:   Viral meningitis: Probable Active Problems:   Leukocytosis   Headache  #1 probable viral meningitis Patient presenting with severe headache, nuchal rigidity, leukocytosis with a white count of 14,000. Patient with prior history of viral meningitis. Patient had CT head which was negative. Patient  underwent a lumbar puncture which shows some predominant lymphocytosis. Concern for possible HSV meningitis. HSV DNA PCR to CSF has been sent off. Will check a cryptococcal PCR as well as a VDRL to CSF. Check RPR. CSF Gram stain and culture pending. Place on IV acyclovir, IV fluids, pain management, supportive care. We'll consult with infectious diseases for further evaluation and management.  #2 leukocytosis Likely secondary to problem #1. Urinalysis is nitrite negative leukocytes negative. Patient with no respiratory symptoms. Patient on IV acyclovir. Follow for now.  #3 headache Secondary to problem #1.  #4 prophylaxis SCDs for DVT prophylaxis.   Code Status: Full DVT Prophylaxis: SCDs Family Communication: Updated patient, father, mother at bedside. Disposition Plan: Admit to MedSurg floor.  Time spent: [facility] Pager [phone]

## 2014-11-27 NOTE — ED Notes (Signed)
Dr. Grandville Silos is speaking with pt. and his parents as I write this.  He remains alert and oriented x 4 with clear speech.

## 2014-11-27 NOTE — ED Notes (Signed)
Dr. Grandville Silos came to see pt., who informed him he does not wish to be admitted.  Dr. Lacinda Axon is speaking with pt. And his parents as I write this.

## 2014-11-27 NOTE — Progress Notes (Signed)
ANTIBIOTIC CONSULT NOTE - INITIAL  Pharmacy Consult for Acyclovir Indication: Meningitis  No Known Allergies  Patient Measurements:   Adjusted Body Weight:   Vital Signs: Temp: 99.5 F (37.5 C) (11/19 1427) Temp Source: Oral (11/19 1427) BP: 139/75 mmHg (11/19 1427) Pulse Rate: 94 (11/19 1427) Intake/Output from previous day:   Intake/Output from this shift:    Labs:  Recent Labs  11/27/14 1105  WBC 14.0*  HGB 14.6  PLT 211  CREATININE 0.96   Estimated Creatinine Clearance: 82.2 mL/min (by C-G formula based on Cr of 0.96). No results for input(s): VANCOTROUGH, VANCOPEAK, VANCORANDOM, GENTTROUGH, GENTPEAK, GENTRANDOM, TOBRATROUGH, TOBRAPEAK, TOBRARND, AMIKACINPEAK, AMIKACINTROU, AMIKACIN in the last 72 hours.   Microbiology: Recent Results (from the past 720 hour(s))  CSF culture with Stat gram stain     Status: None (Preliminary result)   Collection Time: 11/27/14 12:48 PM  Result Value Ref Range Status   Specimen Description CSF  Final   Special Requests NONE  Final   Gram Stain   Final    WBC PRESENT, PREDOMINANTLY MONONUCLEAR NO ORGANISMS SEEN Gram Stain Report Called to,Read Back By and Verified With: BROWN,L AT [id number] ON [id number] BY HOOKER,B    Culture PENDING  Incomplete   Report Status PENDING  Incomplete    Medical History: Past Medical History  Diagnosis Date  . Hemorrhoids   . Anal fissure   . Viral meningitis 2013    had spinal tap.  Marland Kitchen BPH (benign prostatic hyperplasia)     w/o LUTS  . Peyronie's disease   . Erectile dysfunction due to arterial insufficiency    Assessment: 75 yoM with hx viral meningitis presents with severe HA, neck pain, weakness, lethargy and and malaise. LP performed and CSF shows glucose 58, protein 162, WBC 298. Pharmacy consulted to start acyclovir for suspected viral meningitis.  First dose ordered in ED.   Anti-infectives 11/19 >> Acyclovir  >>  Vitals/Labs WBC: Elevated 14 Tm24h: 99.5 Renal: SCr 0.96, CrCl > 50  ml/min  Cultures 11/19 CSF w/gram stain: pending 11/19 HSV: collected Cryptococcal antigen, RPR, VDRL ordered  Goal of Therapy:  Eradication of infection  Plan:  Acyclovir 10mg /kg IV q8h F/u renal function, micro, clinical course  Ralene Bathe, PharmD, BCPS 11/27/2014, 5:08 PM  Pager: [phone]

## 2014-11-27 NOTE — ED Notes (Signed)
Patient reports a history of viral meniningitis 3 years ago. Patiaent reports that he had a headache yesterday and today the headache is 10/10, slight stiffness in his neck and weakness. Patient states he feels like he id when he had viral meningitis.

## 2014-11-27 NOTE — ED Notes (Signed)
Report just called to Maudie Mercury, RN on [address]--will transport shortly.

## 2014-11-27 NOTE — ED Notes (Signed)
He remains in no distress and his parents are with him.

## 2014-11-28 DIAGNOSIS — G032 Benign recurrent meningitis [Mollaret]: Secondary | ICD-10-CM | POA: Insufficient documentation

## 2014-11-28 DIAGNOSIS — G030 Nonpyogenic meningitis: Secondary | ICD-10-CM

## 2014-11-28 DIAGNOSIS — Z113 Encounter for screening for infections with a predominantly sexual mode of transmission: Secondary | ICD-10-CM | POA: Insufficient documentation

## 2014-11-28 DIAGNOSIS — Z7251 High risk heterosexual behavior: Secondary | ICD-10-CM

## 2014-11-28 LAB — CBC
HCT: 37.6 % — ABNORMAL LOW (ref 39.0–52.0)
Hemoglobin: 12.6 g/dL — ABNORMAL LOW (ref 13.0–17.0)
MCH: 31.7 pg (ref 26.0–34.0)
MCHC: 33.5 g/dL (ref 30.0–36.0)
MCV: 94.5 fL (ref 78.0–100.0)
Platelets: 183 10*3/uL (ref 150–400)
RBC: 3.98 MIL/uL — ABNORMAL LOW (ref 4.22–5.81)
RDW: 13 % (ref 11.5–15.5)
WBC: 8.9 10*3/uL (ref 4.0–10.5)

## 2014-11-28 LAB — BASIC METABOLIC PANEL
Anion gap: 4 — ABNORMAL LOW (ref 5–15)
BUN: 12 mg/dL (ref 6–20)
CO2: 26 mmol/L (ref 22–32)
Calcium: 7.9 mg/dL — ABNORMAL LOW (ref 8.9–10.3)
Chloride: 107 mmol/L (ref 101–111)
Creatinine, Ser: 1.02 mg/dL (ref 0.61–1.24)
GFR calc Af Amer: >60 mL/min (ref >60)
GFR calc non Af Amer: >60 mL/min (ref >60)
Glucose, Bld: 115 mg/dL — ABNORMAL HIGH (ref 65–99)
Potassium: 4 mmol/L (ref 3.5–5.1)
Sodium: 137 mmol/L (ref 135–145)

## 2014-11-28 LAB — CRYPTOCOCCAL ANTIGEN, CSF: Crypto Ag: NEGATIVE

## 2014-11-28 LAB — RPR: RPR Ser Ql: NONREACTIVE

## 2014-11-28 MED ORDER — OXYCODONE HCL 5 MG PO TABS: 5.0000 mg | ORAL_TABLET | ORAL | Status: DC | PRN

## 2014-11-28 MED ORDER — MORPHINE SULFATE (PF) 2 MG/ML IV SOLN: 2.0000 mg | INTRAVENOUS | Status: DC | PRN

## 2014-11-28 MED ORDER — VALACYCLOVIR HCL 1 G PO TABS: 1000.0000 mg | ORAL_TABLET | Freq: Three times a day (TID) | ORAL | Status: DC

## 2014-11-28 MED ORDER — VALACYCLOVIR HCL 500 MG PO TABS
500.0000 mg | ORAL_TABLET | Freq: Three times a day (TID) | ORAL | Status: DC
Start: 2014-11-28 — End: 2014-11-28
  Filled 2014-11-28 (×2): qty 1

## 2014-11-28 NOTE — Progress Notes (Signed)
[facility] PROGRESS NOTE  John HEYSER [id number] DOB: 01/04/1961 DOA: 11/27/2014 PCP: Kathlene November, MD  Assessment/Plan: #1 viral meningitis This is patient's second episode of viral meningitis, likely an HSV meningitis. Patient's initial episode was on 11/02/2010. Patient with clinical improvement. CSF culture and Gram stain with no growth to date currently still pending. Cryptococcal antigen in the CSF was negative. HSV PCR CSF pending. RPR pending. VDRL CSF fluid pending. Continue empiric IV acyclovir. Patient may need prophylactic therapy once acute episode has been treated if HSV PCR in the CSF is positive. Continue IV fluids. Continue supportive care. Droplet precautions. Infectious disease consultation pending.  #2 leukocytosis Likely secondary to problem #1. Urinalysis is negative. Patient with no respiratory symptoms. CSF cultures and Gram stain pending. Leukocytosis is trending down. Continue empiric IV acyclovir.  #3 headache Secondary to problem #1. Clinical improvement. Continue current therapy.  #4 prophylaxis SCDs for DVT prophylaxis.  Code Status: full Family Communication: Updated patient. No family present. Disposition Plan: Pending infectious disease evaluation. Patient seems very insistent on being discharged today.   Consultants:  Infectious diseases pending  Procedures:  CT head 11/27/2014  lumbar puncture per Dr. Lacinda Axon 11/27/2014    Antibiotics: IV acyclovir 11/27/2014  HPI/Subjective: Patient states he is feeling better. Headache is improved. Nuchal rigidity has improved. Patient asking when he can go home.  Objective: Filed Vitals:   11/28/14 0452  BP: 118/59  Pulse: 75  Temp: 98.6 F (37 C)  Resp: 17    Intake/Output Summary (Last 24 hours) at 11/28/14 0923 Last data filed at 11/28/14 0617  Gross per 24 hour  Intake 1732.7 ml  Output      0 ml  Net 1732.7 ml   Filed Weights   11/27/14 1750 11/28/14 0452  Weight: 79.4 kg  (175 lb 0.7 oz) 83.2 kg (183 lb 6.8 oz)    Exam:   General:  NAD  Cardiovascular: RRR  Respiratory: CTAB  Abdomen: Soft, nontender, nondistended, positive bowel sounds.  Musculoskeletal: No clubbing cyanosis or edema.  Data Reviewed: Basic Metabolic Panel:  Recent Labs Lab 11/27/14 1105 11/27/14 1655 11/28/14 0502  NA 138  --  137  K 3.9  --  4.0  CL 105  --  107  CO2 25  --  26  GLUCOSE 129*  --  115*  BUN 14  --  12  CREATININE 0.96  --  1.02  CALCIUM 8.9  --  7.9*  MG  --  1.8  --    Liver Function Tests:  Recent Labs Lab 11/27/14 1105  AST 23  ALT 35  ALKPHOS 51  BILITOT 0.8  PROT 6.8  ALBUMIN 4.2   No results for input(s): LIPASE, AMYLASE in the last 168 hours. No results for input(s): AMMONIA in the last 168 hours. CBC:  Recent Labs Lab 11/27/14 1105 11/28/14 0502  WBC 14.0* 8.9  NEUTROABS 12.5*  --   HGB 14.6 12.6*  HCT 43.9 37.6*  MCV 94.4 94.5  PLT 211 183   Cardiac Enzymes: No results for input(s): CKTOTAL, CKMB, CKMBINDEX, TROPONINI in the last 168 hours. BNP (last 3 results) No results for input(s): BNP in the last 8760 hours.  ProBNP (last 3 results) No results for input(s): PROBNP in the last 8760 hours.  CBG: No results for input(s): GLUCAP in the last 168 hours.  Recent Results (from the past 240 hour(s))  CSF culture with Stat gram stain     Status: None (Preliminary result)  Collection Time: 11/27/14 12:48 PM  Result Value Ref Range Status   Specimen Description CSF  Final   Special Requests NONE  Final   Gram Stain   Final    WBC PRESENT, PREDOMINANTLY MONONUCLEAR NO ORGANISMS SEEN Gram Stain Report Called to,Read Back By and Verified With: BROWN,L AT [id number] ON [id number] BY HOOKER,B    Culture PENDING  Incomplete   Report Status PENDING  Incomplete     Studies: Ct Head Wo Contrast  11/27/2014  CLINICAL DATA:  Patient reports a history of viral meniningitis 3 years ago. Patiaent reports that he had a headache  yesterday and today the headache is 10/10, slight stiffness in his neck and weakness. EXAM: CT HEAD WITHOUT CONTRAST TECHNIQUE: Contiguous axial images were obtained from the base of the skull through the vertex without intravenous contrast. COMPARISON:  None. FINDINGS: The ventricles are normal in size and configuration. There are no parenchymal masses or mass effect. There is no evidence of an infarct. There are no extra-axial masses or abnormal fluid collections. There is no intracranial hemorrhage. There is mild maxillary sinus mucosal thickening. Small mucous retention cysts and minor mucosal thickening is noted along the maxillary sinuses. Clear mastoid air cells. No skull lesion. IMPRESSION: 1. No intracranial abnormality. 2. Mild sinus mucosal thickening. Electronically Signed   By: Lajean Manes M.D.   On: 11/27/2014 11:51    Scheduled Meds: . acyclovir  10 mg/kg Intravenous Q8H  . aspirin  81 mg Oral Daily  . docusate sodium  100 mg Oral BID  . Influenza vac split quadrivalent PF  0.5 mL Intramuscular Once  . lidocaine-EPINEPHrine  20 mL Intradermal Once  .  morphine injection  4 mg Intravenous Once  . sodium chloride  1,000 mL Intravenous Once   Continuous Infusions: . sodium chloride 125 mL/hr at 11/28/14 0153    Principal Problem:   Viral meningitis: Probable Active Problems:   Leukocytosis   Headache    Time spent: [place] MD [facility] Pager [phone]. If 7PM-7AM, please contact night-coverage at [email or web address], password [facility] 11/28/2014, 9:23 AM  LOS: 1 day

## 2014-11-28 NOTE — Progress Notes (Signed)
Utilization Review Completed.Elice Crigger T11/20/2016  

## 2014-11-28 NOTE — Consult Note (Signed)
[facility] for Infectious Disease    Date of Admission:  11/27/2014  Date of Consult:  11/28/2014  Reason for Consult: aseptic meningitis, recurrent Referring Physician: Dr. Grandville Silos   HPI: John Harper is an 54 y.o. male with hx of prior episode of aseptic meningitis in 2012 status post lumbar puncture with cultures that were negative. He states at that time that he was discharged to home. He presentesevere headache yet againd with pain behind the eyes stiffness chills weakness lethargy and malaise along with myalgias nausea and fatigue.   He underwent lumbar puncture in the [facility] department  With tube #1   238 white blood cells with 86% lymphocytes 4% segmented neutrophils and 10% monocytes, 30 were 5 red blood cells  2 #4:  298 white blood cells 85% lymphocytes 3% segmented neutrophils 12% monocytes 30 red blood cells  CSF glucose of 58 and protein of 162.  CSF Gram stain was negative and CSF cultures have been negative so far.  He was admitted and placed on IV acyclovir. He feels better overnight and is anxious to go home.  He was tested for HIV, HCV with PCP and these were negative.  Not clear if prior episode of viral meningitis involve CSF testing for herpes simplex though this would be the most likely culprit for recurrent meningitis.  With regards to his sexual history he is divorced and single with a girlfriend but also has had sex with other woman in addition to his study girlfriend. Is not a history of sex with other men or history of IV drug use. He denies having said sex with prostitutes.  He has had oral sores consistent with herpes simple type I but no outbreaks since his with herpes simple type II. He states that his former spouse had apparent HSV-2 he believes since she was taking Valtrex chronically.   Past Medical History  Diagnosis Date  . Hemorrhoids   . Anal fissure   . Viral meningitis 2013    had spinal tap.  Marland Kitchen BPH  (benign prostatic hyperplasia)     w/o LUTS  . Peyronie's disease   . Erectile dysfunction due to arterial insufficiency     Past Surgical History  Procedure Laterality Date  . Colonoscopy    . Polypectomy      Social History:  reports that he quit smoking about 4 weeks ago. His smoking use included Cigarettes. He has never used smokeless tobacco. He reports that he drinks about 18.6 oz of alcohol per week. He reports that he does not use illicit drugs.   Family History  Problem Relation Age of Onset  . Pancreatic cancer Father   . Coronary artery disease Father 60    CABG x2  . Cancer Father 61    kidney...prostate  . Prostate cancer Father   . Bladder Cancer Father   . Colon cancer Neg Hx   . Stomach cancer Neg Hx   . Esophageal cancer Neg Hx   . Rectal cancer Neg Hx   . Stroke Mother   . Neuropathy Mother   . Hypertension Brother   . Diabetes Other     aunts and uncles    No Known Allergies   Medications: I have reviewed patients current medications as documented in Epic Anti-infectives    Start     Dose/Rate Route Frequency Ordered Stop   11/28/14 1600  valACYclovir (VALTREX) tablet 500 mg  500 mg Oral 3 times daily 11/28/14 1415     11/28/14 0200  acyclovir (ZOVIRAX) 770 mg in dextrose 5 % 150 mL IVPB  Status:  Discontinued     10 mg/kg  77.2 kg 165.4 mL/hr over 60 Minutes Intravenous Every 8 hours 11/27/14 1708 11/28/14 1415   11/28/14 0000  valACYclovir (VALTREX) 1000 MG tablet     1,000 mg Oral 3 times daily 11/28/14 1503     11/27/14 1600  acyclovir (ZOVIRAX) 770 mg in dextrose 5 % 150 mL IVPB     10 mg/kg  77.2 kg 165.4 mL/hr over 60 Minutes Intravenous  Once 11/27/14 1519 11/27/14 1807         ROS: as per history of present illness and pertinent for headache photophobia neck stiffness chills malaise otherwise negative on 12 point review of systems   Blood pressure 121/63, pulse 73, temperature 98.3 F (36.8 C), temperature source Oral,  resp. rate 16, height 5' 7" (1.702 m), weight 183 lb 6.8 oz (83.2 kg), SpO2 97 %. General: Alert and awake, oriented x3, not in any acute distress. HEENT: anicteric sclera,  EOMI, oropharynx clear and without exudate Cardiovascular: egular rate, normal r,  no murmur rubs or gallops Pulmonary: clear to auscultation bilaterally, no wheezing, rales or rhonchi Gastrointestinal: soft nontender, nondistended, normal bowel sounds, Musculoskeletal: no  clubbing or edema noted bilaterally Skin, soft tissue: no rashes Neuro: nonfocal, strength and sensation intact   Results for orders placed or performed during the hospital encounter of 11/27/14 (from the past 48 hour(s))  CBC with Differential     Status: Abnormal   Collection Time: 11/27/14 11:05 AM  Result Value Ref Range   WBC 14.0 (H) 4.0 - 10.5 K/uL   RBC 4.65 4.22 - 5.81 MIL/uL   Hemoglobin 14.6 13.0 - 17.0 g/dL   HCT 43.9 39.0 - 52.0 %   MCV 94.4 78.0 - 100.0 fL   MCH 31.4 26.0 - 34.0 pg   MCHC 33.3 30.0 - 36.0 g/dL   RDW 13.1 11.5 - 15.5 %   Platelets 211 150 - 400 K/uL   Neutrophils Relative % 89 %   Neutro Abs 12.5 (H) 1.7 - 7.7 K/uL   Lymphocytes Relative 6 %   Lymphs Abs 0.8 0.7 - 4.0 K/uL   Monocytes Relative 5 %   Monocytes Absolute 0.8 0.1 - 1.0 K/uL   Eosinophils Relative 0 %   Eosinophils Absolute 0.0 0.0 - 0.7 K/uL   Basophils Relative 0 %   Basophils Absolute 0.0 0.0 - 0.1 K/uL  Comprehensive metabolic panel     Status: Abnormal   Collection Time: 11/27/14 11:05 AM  Result Value Ref Range   Sodium 138 135 - 145 mmol/L   Potassium 3.9 3.5 - 5.1 mmol/L   Chloride 105 101 - 111 mmol/L   CO2 25 22 - 32 mmol/L   Glucose, Bld 129 (H) 65 - 99 mg/dL   BUN 14 6 - 20 mg/dL   Creatinine, Ser 0.96 0.61 - 1.24 mg/dL   Calcium 8.9 8.9 - 10.3 mg/dL   Total Protein 6.8 6.5 - 8.1 g/dL   Albumin 4.2 3.5 - 5.0 g/dL   AST 23 15 - 41 U/L   ALT 35 17 - 63 U/L   Alkaline Phosphatase 51 38 - 126 U/L   Total Bilirubin 0.8 0.3 - 1.2  mg/dL   GFR calc non Af Amer >60 >60 mL/min   GFR calc Af Amer >60 >60 mL/min  Comment: (NOTE) The eGFR has been calculated using the CKD EPI equation. This calculation has not been validated in all clinical situations. eGFR's persistently <60 mL/min signify possible Chronic Kidney Disease.    Anion gap 8 5 - 15  Urinalysis, Routine w reflex microscopic     Status: None   Collection Time: 11/27/14 12:00 PM  Result Value Ref Range   Color, Urine YELLOW YELLOW   APPearance CLEAR CLEAR   Specific Gravity, Urine 1.028 1.005 - 1.030   pH 6.5 5.0 - 8.0   Glucose, UA NEGATIVE NEGATIVE mg/dL   Hgb urine dipstick NEGATIVE NEGATIVE   Bilirubin Urine NEGATIVE NEGATIVE   Ketones, ur NEGATIVE NEGATIVE mg/dL   Protein, ur NEGATIVE NEGATIVE mg/dL   Nitrite NEGATIVE NEGATIVE   Leukocytes, UA NEGATIVE NEGATIVE    Comment: MICROSCOPIC NOT DONE ON URINES WITH NEGATIVE PROTEIN, BLOOD, LEUKOCYTES, NITRITE, OR GLUCOSE <1000 mg/dL.  Cryptococcal antigen, CSF     Status: None   Collection Time: 11/27/14 12:30 PM  Result Value Ref Range   Crypto Ag NEGATIVE NEGATIVE   Cryptococcal Ag Titer NOT INDICATED NOT INDICATED    Comment: Performed at [facility]  CSF cell count with differential collection tube #: 1     Status: Abnormal   Collection Time: 11/27/14 12:48 PM  Result Value Ref Range   Tube # 1    Color, CSF COLORLESS COLORLESS   Appearance, CSF CLEAR CLEAR   Supernatant NOT INDICATED    RBC Count, CSF 35 (H) 0 /cu mm   WBC, CSF 238 (HH) 0 - 5 /cu mm    Comment: CRITICAL RESULT CALLED TO, READ BACK BY AND VERIFIED WITH: BROWN,L AT 1515 ON [id number] BY HOOKER,B    Segmented Neutrophils-CSF 4 0 - 6 %   Lymphs, CSF 86 (H) 40 - 80 %   Monocyte-Macrophage-Spinal Fluid 10 (L) 15 - 45 %   Eosinophils, CSF 0 0 - 1 %   Other Cells, CSF NONE   CSF cell count with differential collection tube #: 4     Status: Abnormal   Collection Time: 11/27/14 12:48 PM  Result Value Ref Range   Tube # 4      Color, CSF COLORLESS COLORLESS   Appearance, CSF CLEAR CLEAR   Supernatant NOT INDICATED    RBC Count, CSF 30 (H) 0 /cu mm   WBC, CSF 298 (HH) 0 - 5 /cu mm    Comment: CRITICAL RESULT CALLED TO, READ BACK BY AND VERIFIED WITH: BROWN,L AT 1515 ON 111916 BY HOOKER,B    Segmented Neutrophils-CSF 3 0 - 6 %   Lymphs, CSF 85 (H) 40 - 80 %   Monocyte-Macrophage-Spinal Fluid 12 (L) 15 - 45 %   Eosinophils, CSF 0 0 - 1 %   Other Cells, CSF NONE   CSF culture with Stat gram stain     Status: None (Preliminary result)   Collection Time: 11/27/14 12:48 PM  Result Value Ref Range   Specimen Description CSF    Special Requests NONE    Gram Stain      WBC PRESENT, PREDOMINANTLY MONONUCLEAR NO ORGANISMS SEEN Gram Stain Report Called to,Read Back By and Verified With: BROWN,L AT 6629 ON [id number] BY HOOKER,B    Culture      NO GROWTH < 24 HOURS Performed at [facility]    Report Status PENDING   Glucose, CSF     Status: None   Collection Time: 11/27/14 12:48 PM  Result Value  Ref Range   Glucose, CSF 58 40 - 70 mg/dL  Protein, CSF     Status: Abnormal   Collection Time: 11/27/14 12:48 PM  Result Value Ref Range   Total  Protein, CSF 162 (H) 15 - 45 mg/dL  Procalcitonin - Baseline     Status: None   Collection Time: 11/27/14  4:55 PM  Result Value Ref Range   Procalcitonin <0.10 ng/mL    Comment:        Interpretation: PCT (Procalcitonin) <= 0.5 ng/mL: Systemic infection (sepsis) is not likely. Local bacterial infection is possible. (NOTE)         ICU PCT Algorithm               Non ICU PCT Algorithm    ----------------------------     ------------------------------         PCT < 0.25 ng/mL                 PCT < 0.1 ng/mL     Stopping of antibiotics            Stopping of antibiotics       strongly encouraged.               strongly encouraged.    ----------------------------     ------------------------------       PCT level decrease by               PCT < 0.25 ng/mL        >= 80% from peak PCT       OR PCT 0.25 - 0.5 ng/mL          Stopping of antibiotics                                             encouraged.     Stopping of antibiotics           encouraged.    ----------------------------     ------------------------------       PCT level decrease by              PCT >= 0.25 ng/mL       < 80% from peak PCT        AND PCT >= 0.5 ng/mL            Continuin g antibiotics                                              encouraged.       Continuing antibiotics            encouraged.    ----------------------------     ------------------------------     PCT level increase compared          PCT > 0.5 ng/mL         with peak PCT AND          PCT >= 0.5 ng/mL             Escalation of antibiotics  strongly encouraged.      Escalation of antibiotics        strongly encouraged.   RPR     Status: None   Collection Time: 11/27/14  4:55 PM  Result Value Ref Range   RPR Ser Ql Non Reactive Non Reactive    Comment: (NOTE) Performed At: [facility] [place], [place] [id number] Lindon Romp MD PY:[id number]   Magnesium     Status: None   Collection Time: 11/27/14  4:55 PM  Result Value Ref Range   Magnesium 1.8 1.7 - 2.4 mg/dL  Basic metabolic panel     Status: Abnormal   Collection Time: 11/28/14  5:02 AM  Result Value Ref Range   Sodium 137 135 - 145 mmol/L   Potassium 4.0 3.5 - 5.1 mmol/L   Chloride 107 101 - 111 mmol/L   CO2 26 22 - 32 mmol/L   Glucose, Bld 115 (H) 65 - 99 mg/dL   BUN 12 6 - 20 mg/dL   Creatinine, Ser 1.02 0.61 - 1.24 mg/dL   Calcium 7.9 (L) 8.9 - 10.3 mg/dL   GFR calc non Af Amer >60 >60 mL/min   GFR calc Af Amer >60 >60 mL/min    Comment: (NOTE) The eGFR has been calculated using the CKD EPI equation. This calculation has not been validated in all clinical situations. eGFR's persistently <60 mL/min signify possible Chronic Kidney Disease.    Anion gap 4 (L) 5 -  15  CBC     Status: Abnormal   Collection Time: 11/28/14  5:02 AM  Result Value Ref Range   WBC 8.9 4.0 - 10.5 K/uL   RBC 3.98 (L) 4.22 - 5.81 MIL/uL   Hemoglobin 12.6 (L) 13.0 - 17.0 g/dL   HCT 37.6 (L) 39.0 - 52.0 %   MCV 94.5 78.0 - 100.0 fL   MCH 31.7 26.0 - 34.0 pg   MCHC 33.5 30.0 - 36.0 g/dL   RDW 13.0 11.5 - 15.5 %   Platelets 183 150 - 400 K/uL   _0 (sdes,specrequest,cult,reptstatus)   ) Recent Results (from the past 720 hour(s))  CSF culture with Stat gram stain     Status: None (Preliminary result)   Collection Time: 11/27/14 12:48 PM  Result Value Ref Range Status   Specimen Description CSF  Final   Special Requests NONE  Final   Gram Stain   Final    WBC PRESENT, PREDOMINANTLY MONONUCLEAR NO ORGANISMS SEEN Gram Stain Report Called to,Read Back By and Verified With: BROWN,L AT 2458 ON [id number] BY HOOKER,B    Culture   Final    NO GROWTH < 24 HOURS Performed at [facility]    Report Status PENDING  Incomplete     Impression/Recommendation  Principal Problem:   Viral meningitis: Probable Active Problems:   Leukocytosis   Headache   NIMROD WENDT is a 54 y.o. 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 He can then drop down to 1 g of Valtrex daily for prophylaxis.  He can follow-up with [place] in infectious disease clinic next 2-3 weeks for follow-up for his labs  #2 risk for sexual transmitted diseases: He has been screened negative for HIV and hep C is most recent clinic visit with his prior care physician I encouraged him to  make sure that his girlfriends have been also checked for HIV and other sexual transmitted diseases.  I feel he is safe for DC to home.   11/28/2014, 6:03 PM   Thank you so much for this interesting consult  [facility] [phone] (pager) [phone] (office) 11/28/2014, 6:03 PM  Rhina Br[place] 11/28/2014, 6:03 PM

## 2014-11-28 NOTE — Progress Notes (Signed)
PT Cancellation Note  Patient Details Name: John Harper MRN: [id number] DOB: [birth year]   Cancelled Treatment:    Reason Eval/Treat Not Completed: PT screened, no needs identified, will sign off. Pt denied need for PT services. Will sign off.    Weston Anna, MPT Pager: [phone]

## 2014-11-28 NOTE — Progress Notes (Signed)
Discharge instructions explained to pt and 2 prescriptions given to pt. Pt refused a wheelchair at discharge. States feeling pretty good.

## 2014-11-28 NOTE — Discharge Summary (Signed)
Physician Discharge Summary  John Harper [id number] DOB: 21-Jan-1960 DOA: 11/27/2014  PCP: Kathlene November, MD  Admit date: 11/27/2014 Discharge date: 11/28/2014  Time spent: 60 minutes  Recommendations for Outpatient Follow-up:  1. Follow-up with Dr. Tommy Medal, infectious diseases in 2-3 weeks. On follow-up results from CSF cultures and HSV PCR which are pending at the time of discharge May followed up upon. 2. Follow-up with Kathlene November, MD in 1-2 weeks.   Discharge Diagnoses:  Principal Problem:   Viral meningitis: Probable Active Problems:   Leukocytosis   Headache   Discharge Condition: Stable and improved  Diet recommendation: Regular  Filed Weights   11/27/14 1750 11/28/14 0452  Weight: 79.4 kg (175 lb 0.7 oz) 83.2 kg (183 lb 6.8 oz)    History of present illness:  John Harper is a 54 y.o. male  With prior history of viral meningitis in 11/02/2010, hemorrhoids, BPH, Peyronie's disease, ED secondary to arterial insufficiency who presented to ED with 1 day history of severe headache at the back of the head and behind her eyes, neck stiffness, chills, weakness, lethargy, malaise and diffuse myalgias with associated nausea and fatigue. Patient denied any fevers, no shortness of breath, no chest pain, no abdominal pain, no diarrhea, no constipation, no dysuria, no cough, no melena, no hematemesis, no hematochezia. Patient stated recently got over a cold sore. Patient was seen in the emergency room CBC done had a white count of 14.0 with a left shift, comprehensive metabolic profile was unremarkable. Urinalysis was nitrite negative leukocyte esterase negative. CT head was negative. Patient underwent a lumbar puncture. ED physician which showed a RBC of 35 total protein of 162 glucose of 58. 86% lymphocytes WBC of 238 with 4 segmented neutrophils CSF fluid was clear colorless. Gram stain was sent. It was felt patient likely had a viral meningitis and patient was placed on IV  acyclovir. [facility] were called to admit the patient for further evaluation and management.  Hospital Course:  #1 viral meningitis This is patient's second episode of viral meningitis, likely an HSV meningitis. Patient's initial episode was on 11/02/2010. Patient was admitted while CSF cultures and Gram stain were pending. Patient was placed empirically on IV acyclovir, IV fluids, pain management and supportive care. Cryptococcal antigen in the CSF was negative. HSV PCR CSF pending at time of discharge. RPR was nonreactive. VDRL CSF fluid pending. patient improved clinically and was insistent on being discharged. Patient was also placed on droplet precautions during the hospitalization. ID consultation was obtained and patient was seen in consultation by Dr. Lucianne Lei dam on 11/28/2014. Patient had improved clinically. Patient was not toxic appearing. Was recommended that patient could be discharged home safely on Valtrex 1 g 3 times daily 10 days and then 1 g daily for prophylactic therapy patient will follow-up in the ID clinic in 2-3 weeks.  Patient was discharged in stable and improved condition.   #2 leukocytosis Likely secondary to problem #1. Urinalysis was negative. Patient with no respiratory symptoms. CSF cultures and Gram stain pending At time of discharge.. Leukocytosis trended down.  patient was admitted with a viral meningitis and started on IV acyclovir.  Patient's leukocytosis had resolved by day of discharge.  #3 headache Secondary to problem #1. Patient was admitted and treated for his meningitis. Patient was also placed on medications for pain management and symptomatic treatment. Patient's headache improved by day of discharge.   Procedures:  CT head 11/27/2014  lumbar puncture per Dr. Lacinda Axon 11/27/2014  Consultations:  Infectious diseases: Dr. Tommy Medal 11/28/2014.  Discharge Exam: Filed Vitals:   11/28/14 0452  BP: 118/59  Pulse: 75  Temp: 98.6 F (37 C)  Resp:  17    General: NAD Cardiovascular: RRR Respiratory: CTAB  Discharge Instructions   Discharge Instructions    Diet general    Complete by:  As directed      Discharge instructions    Complete by:  As directed   Follow up with Dr Tommy Medal, Infectious Disease in 2-3 weeks. Follow up with Kathlene November, MD in 1-2 weeks.     Increase activity slowly    Complete by:  As directed           Current Discharge Medication List    START taking these medications   Details  oxyCODONE (OXY IR/ROXICODONE) 5 MG immediate release tablet Take 1 tablet (5 mg total) by mouth every 4 (four) hours as needed for moderate pain. Qty: 15 tablet, Refills: 0    valACYclovir (VALTREX) 1000 MG tablet Take 1 tablet (1,000 mg total) by mouth 3 (three) times daily. Take 1 tablet 3 times daily x 10 days, then 1 tablet daily. Qty: 60 tablet, Refills: 0      CONTINUE these medications which have NOT CHANGED   Details  aspirin 81 MG tablet Take 81 mg by mouth daily.      hydrocortisone 2.5 % cream As directed.    Influenza vac split quadrivalent PF (FLUARIX) 0.5 ML injection Inject 0.5 mLs into the muscle once.    ketoconazole (NIZORAL) 2 % cream Apply as directed    tadalafil (CIALIS) 20 MG tablet Take 20 mg by mouth daily as needed for erectile dysfunction.       No Known Allergies Follow-up Information    Follow up with Kathlene November, MD. Schedule an appointment as soon as possible for a visit in 2 weeks.   Specialty:  Internal Medicine   Why:  f/u in 1-2 weeks.   Contact information:   [address] [place] [address] [phone]       Follow up with Alcide Evener, MD. Schedule an appointment as soon as possible for a visit in 3 weeks.   Specialty:  Infectious Diseases   Why:  f/u in 2-3 weeks.   Contact information:   [address] [place] [address] [phone]        The results of significant diagnostics from this hospitalization  (including imaging, microbiology, ancillary and laboratory) are listed below for reference.    Significant Diagnostic Studies: Ct Head Wo Contrast  11/27/2014  CLINICAL DATA:  Patient reports a history of viral meniningitis 3 years ago. Patiaent reports that he had a headache yesterday and today the headache is 10/10, slight stiffness in his neck and weakness. EXAM: CT HEAD WITHOUT CONTRAST TECHNIQUE: Contiguous axial images were obtained from the base of the skull through the vertex without intravenous contrast. COMPARISON:  None. FINDINGS: The ventricles are normal in size and configuration. There are no parenchymal masses or mass effect. There is no evidence of an infarct. There are no extra-axial masses or abnormal fluid collections. There is no intracranial hemorrhage. There is mild maxillary sinus mucosal thickening. Small mucous retention cysts and minor mucosal thickening is noted along the maxillary sinuses. Clear mastoid air cells. No skull lesion. IMPRESSION: 1. No intracranial abnormality. 2. Mild sinus mucosal thickening. Electronically Signed   By: Dedra Skeens.D.  On: 11/27/2014 11:51    Microbiology: Recent Results (from the past 240 hour(s))  CSF culture with Stat gram stain     Status: None (Preliminary result)   Collection Time: 11/27/14 12:48 PM  Result Value Ref Range Status   Specimen Description CSF  Final   Special Requests NONE  Final   Gram Stain   Final    WBC PRESENT, PREDOMINANTLY MONONUCLEAR NO ORGANISMS SEEN Gram Stain Report Called to,Read Back By and Verified With: BROWN,L AT [id number] BY HOOKER,B    Culture   Final    NO GROWTH < 24 HOURS Performed at [facility]    Report Status PENDING  Incomplete     Labs: Basic Metabolic Panel:  Recent Labs Lab 11/27/14 1105 11/27/14 1655 11/28/14 0502  NA 138  --  137  K 3.9  --  4.0  CL 105  --  107  CO2 25  --  26  GLUCOSE 129*  --  115*  BUN 14  --  12  CREATININE 0.96  --  1.02   CALCIUM 8.9  --  7.9*  MG  --  1.8  --    Liver Function Tests:  Recent Labs Lab 11/27/14 1105  AST 23  ALT 35  ALKPHOS 51  BILITOT 0.8  PROT 6.8  ALBUMIN 4.2   No results for input(s): LIPASE, AMYLASE in the last 168 hours. No results for input(s): AMMONIA in the last 168 hours. CBC:  Recent Labs Lab 11/27/14 1105 11/28/14 0502  WBC 14.0* 8.9  NEUTROABS 12.5*  --   HGB 14.6 12.6*  HCT 43.9 37.6*  MCV 94.4 94.5  PLT 211 183   Cardiac Enzymes: No results for input(s): CKTOTAL, CKMB, CKMBINDEX, TROPONINI in the last 168 hours. BNP: BNP (last 3 results) No results for input(s): BNP in the last 8760 hours.  ProBNP (last 3 results) No results for input(s): PROBNP in the last 8760 hours.  CBG: No results for input(s): GLUCAP in the last 168 hours.     SignedIrine Seal MD [facility] 11/28/2014, 3:14 PM

## 2014-11-29 LAB — HERPES SIMPLEX VIRUS(HSV) DNA BY PCR
HSV 1 DNA: NEGATIVE
HSV 2 DNA: POSITIVE — AB

## 2014-11-29 LAB — VDRL, CSF: VDRL Quant, CSF: NONREACTIVE

## 2014-11-29 LAB — PATHOLOGIST SMEAR REVIEW

## 2014-11-30 ENCOUNTER — Telehealth: Payer: Self-pay | Admitting: Internal Medicine

## 2014-11-30 LAB — CSF CULTURE W GRAM STAIN: Culture: NO GROWTH

## 2014-11-30 NOTE — Telephone Encounter (Signed)
Unable to reach patient or leave a voice message. Will try again later.

## 2014-11-30 NOTE — Telephone Encounter (Signed)
Left message for call back.

## 2014-11-30 NOTE — Telephone Encounter (Signed)
John Harper, Please call the patient, the HSV serology came back positive: ask how he is doing, is he taking Valtrex as recommended? Please let me know

## 2014-12-01 NOTE — Telephone Encounter (Signed)
Pt called back stating he has appt 12/22/14 with Dr. Tommy Medal, specialist, for the same thing and doesn't know why he needs appt for the same thing with Dr. Larose Kells.

## 2014-12-01 NOTE — Telephone Encounter (Signed)
Lab results reviewed with patient.  He stated understanding.  Says he's doing well and is taking Valtrex as recommended.  He has had cold sores, but denies genital outbreaks.  He would like to schedule a follow up visit and says that he would call back to schedule once he reviews his calendar.

## 2014-12-01 NOTE — Telephone Encounter (Signed)
He is recovering from a viral meningitis, feeling well and on Valtrex.

## 2014-12-10 NOTE — Telephone Encounter (Signed)
Noted  

## 2014-12-15 ENCOUNTER — Telehealth: Payer: Self-pay | Admitting: Internal Medicine

## 2014-12-15 NOTE — Telephone Encounter (Signed)
Pt said that we did not include his glucose levels on the forms sent to his employer. He is going to have to pay a penalty if it is not updated and returned. Please find form and update and return to his employer ([employer] at fax# [phone]).  This needs done asap so he is not penalized for his insurance benefit.

## 2014-12-16 NOTE — Telephone Encounter (Signed)
Received fax confirmation on 12/16/2014 at 0815.

## 2014-12-16 NOTE — Telephone Encounter (Signed)
Glucose completed and refaxed to [employer] at number provided. Spoke with Pt and informed him that I have refaxed to them, Pt verbalized understanding. Form sent again for scanning to chart.

## 2014-12-16 NOTE — Telephone Encounter (Signed)
I never received this form, not scanned in chart and I do not know where it is. Was it done during visit?

## 2014-12-22 ENCOUNTER — Ambulatory Visit (INDEPENDENT_AMBULATORY_CARE_PROVIDER_SITE_OTHER): Payer: BLUE CROSS/BLUE SHIELD | Admitting: Infectious Disease

## 2014-12-22 ENCOUNTER — Encounter: Payer: Self-pay | Admitting: Infectious Disease

## 2014-12-22 VITALS — BP 153/85 | HR 67 | Temp 97.7°F | Ht 67.0 in | Wt 178.5 lb

## 2014-12-22 DIAGNOSIS — Z113 Encounter for screening for infections with a predominantly sexual mode of transmission: Secondary | ICD-10-CM

## 2014-12-22 DIAGNOSIS — B003 Herpesviral meningitis: Secondary | ICD-10-CM | POA: Diagnosis not present

## 2014-12-22 DIAGNOSIS — G032 Benign recurrent meningitis [Mollaret]: Secondary | ICD-10-CM

## 2014-12-22 DIAGNOSIS — A879 Viral meningitis, unspecified: Secondary | ICD-10-CM

## 2014-12-22 HISTORY — DX: Herpesviral meningitis: B00.3

## 2014-12-22 MED ORDER — VALACYCLOVIR HCL 1 G PO TABS: 1000.0000 mg | ORAL_TABLET | Freq: Every day | ORAL | Status: DC

## 2014-12-22 NOTE — Progress Notes (Signed)
Chief complaint: followup for HSV recurrent meningitis Subjective:    Patient ID: John Harper, male    DOB: Aug 26, 1960, 54 y.o.   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 Prior episode 4 years ago does not appear to have involved HSV PCR testing.  He was VERY distressed to learn of HSV 2 + test. He never had any HSV outbreaks in the past. His previous wife DID suffer from HSV 2 and thus he could have contracted HSV2 from her, but so could he from ANY of his sexual partners in his lifetime.  He was given IV acyclovir as an inpatient and then dc high dose valtrex and now on daily valtrex for prophylaxis.  Past Medical History  Diagnosis Date  . Hemorrhoids   . Anal fissure   . Viral meningitis 2013    had spinal tap.  Marland Kitchen BPH (benign prostatic hyperplasia)     w/o LUTS  . Peyronie's disease   . Erectile dysfunction due to arterial insufficiency     Past Surgical History  Procedure Laterality Date  . Colonoscopy    . Polypectomy      Family History  Problem Relation Age of Onset  . Pancreatic cancer Father   . Coronary artery disease Father 55    CABG x2  . Cancer Father 15    kidney...prostate  . Prostate cancer Father   . Bladder Cancer Father   . Colon cancer Neg Hx   . Stomach cancer Neg Hx   . Esophageal cancer Neg Hx   . Rectal cancer Neg Hx   . Stroke Mother   . Neuropathy Mother   . Hypertension Brother   . Diabetes Other     aunts and uncles      Social History   Social History  . Marital Status: Divorced    Spouse Name: N/A  . Number of Children: 2  . Years of Education: N/A   Occupational History  . sales rep    Social History Main Topics  . Smoking status: Former Smoker    Types: Cigarettes    Quit date: 10/28/2014  . Smokeless tobacco: Never Used     Comment: quit 2010  . Alcohol Use: 18.6 oz/week    14 Cans of beer, 2 Shots of liquor, 15 Standard  drinks or equivalent per week     Comment: weekends  . Drug Use: No  . Sexual Activity: Not Asked   Other Topics Concern  . None   Social History Narrative   Lives by himself   Twins, boy-girl    No Known Allergies   Current outpatient prescriptions:  .  aspirin 81 MG tablet, Take 81 mg by mouth daily.  , Disp: , Rfl:  .  hydrocortisone 2.5 % cream, As directed., Disp: , Rfl:  .  Influenza vac split quadrivalent PF (FLUARIX) 0.5 ML injection, Inject 0.5 mLs into the muscle once., Disp: , Rfl:  .  ketoconazole (NIZORAL) 2 % cream, Apply as directed, Disp: , Rfl:  .  oxyCODONE (OXY IR/ROXICODONE) 5 MG immediate release tablet, Take 1 tablet (5 mg total) by mouth every 4 (four) hours as needed for moderate pain., Disp: 15 tablet, Rfl: 0 .  tadalafil (CIALIS) 20 MG tablet, Take 20 mg by mouth daily as needed for erectile dysfunction., Disp: , Rfl:  .  valACYclovir (VALTREX) 1000 MG tablet, Take 1 tablet (1,000  mg total) by mouth daily., Disp: 30 tablet, Rfl: 11   Review of Systems  Constitutional: Negative for fever, chills, diaphoresis, activity change, appetite change, fatigue and unexpected weight change.  HENT: Negative for congestion, rhinorrhea, sinus pressure, sneezing, sore throat and trouble swallowing.   Eyes: Negative for photophobia and visual disturbance.  Respiratory: Negative for cough, chest tightness, shortness of breath, wheezing and stridor.   Cardiovascular: Negative for chest pain, palpitations and leg swelling.  Gastrointestinal: Negative for nausea, vomiting, abdominal pain, diarrhea, constipation, blood in stool, abdominal distention and anal bleeding.  Genitourinary: Negative for dysuria, hematuria, flank pain and difficulty urinating.  Musculoskeletal: Negative for myalgias, back pain, joint swelling, arthralgias and gait problem.  Skin: Negative for color change, pallor, rash and wound.  Neurological: Negative for dizziness, tremors, weakness and  light-headedness.  Hematological: Negative for adenopathy. Does not bruise/bleed easily.  Psychiatric/Behavioral: Positive for dysphoric mood. Negative for behavioral problems, confusion, sleep disturbance and agitation. The patient is nervous/anxious.        Objective:   Physical Exam  Constitutional: He is oriented to person, place, and time. He appears well-developed and well-nourished.  HENT:  Head: Normocephalic and atraumatic.  Eyes: Conjunctivae and EOM are normal.  Neck: Normal range of motion. Neck supple.  Cardiovascular: Normal rate and regular rhythm.   Pulmonary/Chest: Effort normal. No respiratory distress. He has no wheezes.  Abdominal: Soft. He exhibits no distension.  Musculoskeletal: Normal range of motion. He exhibits no edema or tenderness.  Neurological: He is alert and oriented to person, place, and time.  Skin: Skin is warm and dry. No rash noted. No erythema. No pallor.  Psychiatric: His behavior is normal. Judgment and thought content normal. His mood appears anxious.          Assessment & Plan:   HSV 2 recurrent Mollaret's meningitis:  Not clear that antiviral therapy as prophylaxis will make a tremendous impact but given high tolerability of  Valtrex and low risk for AE and will reduce shedding will place him on chronic valtrex 1 g daily  STD screening: HIV, HCV negative.  I spent greater than 40 minutes with the patient including greater than 50% of time in face to face counsel of the patient re his HSV2 recurrent meningitis, STD testing  and in coordination of his care.

## 2015-06-03 DIAGNOSIS — N400 Enlarged prostate without lower urinary tract symptoms: Secondary | ICD-10-CM | POA: Diagnosis not present

## 2015-06-03 DIAGNOSIS — N5201 Erectile dysfunction due to arterial insufficiency: Secondary | ICD-10-CM | POA: Diagnosis not present

## 2015-06-03 DIAGNOSIS — N486 Induration penis plastica: Secondary | ICD-10-CM | POA: Diagnosis not present

## 2015-06-03 DIAGNOSIS — Z0000 Encounter for general adult medical examination without abnormal findings: Secondary | ICD-10-CM | POA: Diagnosis not present

## 2015-10-13 ENCOUNTER — Other Ambulatory Visit: Payer: Self-pay | Admitting: Infectious Disease

## 2015-10-13 ENCOUNTER — Telehealth: Payer: Self-pay | Admitting: *Deleted

## 2015-10-13 DIAGNOSIS — A879 Viral meningitis, unspecified: Secondary | ICD-10-CM

## 2015-10-13 NOTE — Telephone Encounter (Signed)
Patient called requesting a refill on valacyclovir. He was last seen 12/2014 and no follow up appointment. Please advise

## 2015-10-18 ENCOUNTER — Other Ambulatory Visit: Payer: Self-pay | Admitting: *Deleted

## 2015-10-18 DIAGNOSIS — A879 Viral meningitis, unspecified: Secondary | ICD-10-CM

## 2015-10-18 MED ORDER — VALACYCLOVIR HCL 1 G PO TABS: ORAL_TABLET | ORAL | 3 refills | Status: DC

## 2015-10-18 NOTE — Telephone Encounter (Signed)
Thanks

## 2015-10-18 NOTE — Telephone Encounter (Signed)
Patient notified and Rx sent

## 2015-10-18 NOTE — Telephone Encounter (Signed)
That is fine to fill for him with 3 refills but he should get from his PCP in future

## 2016-02-28 ENCOUNTER — Telehealth: Payer: Self-pay | Admitting: Internal Medicine

## 2016-02-28 DIAGNOSIS — A879 Viral meningitis, unspecified: Secondary | ICD-10-CM

## 2016-02-28 MED ORDER — VALACYCLOVIR HCL 1 G PO TABS: 1000.0000 mg | ORAL_TABLET | Freq: Every day | ORAL | 1 refills | Status: DC

## 2016-02-28 NOTE — Telephone Encounter (Signed)
Pt said that he spoke with office and they advised him to speak with PCP in regards to refill.

## 2016-02-28 NOTE — Telephone Encounter (Signed)
Okay  #30, one refill. If he needs further refills from me needs to schedule CPX

## 2016-02-28 NOTE — Telephone Encounter (Signed)
Dr. Tommy Medal at ID maintains Pt's Valtrex, will be unable to fill. Also, last OV w/ PCP 11/2014. Recommend he schedule appt.

## 2016-02-28 NOTE — Telephone Encounter (Signed)
Rx sent 

## 2016-02-28 NOTE — Telephone Encounter (Signed)
Please advise 

## 2016-02-28 NOTE — Telephone Encounter (Signed)
Self.   Refill for valACYclovir   Pharmacy:  [place], [place] - [id number] AT [address]   CB: [phone]

## 2016-04-13 MED ORDER — VALACYCLOVIR HCL 1 G PO TABS: 1000.0000 mg | ORAL_TABLET | Freq: Every day | ORAL | 0 refills | Status: DC

## 2016-04-13 NOTE — Telephone Encounter (Signed)
Patient next appt. 05/11/16. Patient states he needs refill valACYclovir (VALTREX) 1000 MG tablet until his next appt.  Please advise  PC

## 2016-04-13 NOTE — Addendum Note (Signed)
Addended byDamita Dunnings D on: 04/13/2016 04:27 PM   Modules accepted: Orders

## 2016-04-13 NOTE — Telephone Encounter (Signed)
Rx sent 

## 2016-05-11 ENCOUNTER — Encounter: Payer: Self-pay | Admitting: Internal Medicine

## 2016-05-11 ENCOUNTER — Ambulatory Visit (INDEPENDENT_AMBULATORY_CARE_PROVIDER_SITE_OTHER): Payer: BLUE CROSS/BLUE SHIELD | Admitting: Internal Medicine

## 2016-05-11 VITALS — BP 118/68 | HR 68 | Temp 98.1°F | Resp 14 | Ht 67.0 in | Wt 170.4 lb

## 2016-05-11 DIAGNOSIS — Z23 Encounter for immunization: Secondary | ICD-10-CM

## 2016-05-11 DIAGNOSIS — Z0000 Encounter for general adult medical examination without abnormal findings: Secondary | ICD-10-CM

## 2016-05-11 LAB — COMPREHENSIVE METABOLIC PANEL
ALT: 16 U/L (ref 0–53)
AST: 16 U/L (ref 0–37)
Albumin: 4.2 g/dL (ref 3.5–5.2)
Alkaline Phosphatase: 53 U/L (ref 39–117)
BUN: 17 mg/dL (ref 6–23)
CO2: 28 mEq/L (ref 19–32)
Calcium: 9.5 mg/dL (ref 8.4–10.5)
Chloride: 108 mEq/L (ref 96–112)
Creatinine, Ser: 1.02 mg/dL (ref 0.40–1.50)
GFR: 80.22 mL/min (ref >60.00)
Glucose, Bld: 85 mg/dL (ref 70–99)
Potassium: 3.9 mEq/L (ref 3.5–5.1)
Sodium: 141 mEq/L (ref 135–145)
Total Bilirubin: 0.5 mg/dL (ref 0.2–1.2)
Total Protein: 6.6 g/dL (ref 6.0–8.3)

## 2016-05-11 LAB — CBC WITH DIFFERENTIAL/PLATELET
Basophils Absolute: 0.1 10*3/uL (ref 0.0–0.1)
Basophils Relative: 0.9 % (ref 0.0–3.0)
Eosinophils Absolute: 0.4 10*3/uL (ref 0.0–0.7)
Eosinophils Relative: 5.9 % — ABNORMAL HIGH (ref 0.0–5.0)
HCT: 45.6 % (ref 39.0–52.0)
Hemoglobin: 15.2 g/dL (ref 13.0–17.0)
Lymphocytes Relative: 20.8 % (ref 12.0–46.0)
Lymphs Abs: 1.4 10*3/uL (ref 0.7–4.0)
MCHC: 33.3 g/dL (ref 30.0–36.0)
MCV: 97.9 fl (ref 78.0–100.0)
Monocytes Absolute: 0.7 10*3/uL (ref 0.1–1.0)
Monocytes Relative: 10.7 % (ref 3.0–12.0)
Neutro Abs: 4.1 10*3/uL (ref 1.4–7.7)
Neutrophils Relative %: 61.7 % (ref 43.0–77.0)
Platelets: 215 10*3/uL (ref 150.0–400.0)
RBC: 4.66 Mil/uL (ref 4.22–5.81)
RDW: 13.7 % (ref 11.5–15.5)
WBC: 6.6 10*3/uL (ref 4.0–10.5)

## 2016-05-11 LAB — LIPID PANEL
Cholesterol: 172 mg/dL (ref 0–200)
HDL: 58.4 mg/dL (ref >39.00)
LDL Cholesterol: 90 mg/dL (ref 0–99)
NonHDL: 113.21
Total CHOL/HDL Ratio: 3
Triglycerides: 115 mg/dL (ref 0.0–149.0)
VLDL: 23 mg/dL (ref 0.0–40.0)

## 2016-05-11 LAB — TSH: TSH: 0.8 u[IU]/mL (ref 0.35–4.50)

## 2016-05-11 LAB — FERRITIN: Ferritin: 202.8 ng/mL (ref 22.0–322.0)

## 2016-05-11 LAB — IRON: Iron: 88 ug/dL (ref 42–165)

## 2016-05-11 MED ORDER — VALACYCLOVIR HCL 500 MG PO TABS
500.0000 mg | ORAL_TABLET | Freq: Every day | ORAL | 3 refills | Status: DC
Start: 2016-05-11 — End: 2017-05-22

## 2016-05-11 NOTE — Patient Instructions (Addendum)
GO TO THE LAB : Get the blood work     GO TO THE FRONT DESK Schedule your next appointment for a  Physical exam in year

## 2016-05-11 NOTE — Progress Notes (Signed)
Pre visit review using our clinic review tool, if applicable. No additional management support is needed unless otherwise documented below in the visit note. 

## 2016-05-11 NOTE — Assessment & Plan Note (Addendum)
--  Td 05-2016 +FH prostate ca, father, age 87s +FH CAD father , age 53s --CCS: CCS: cscope #2---12-2013, next 3 years. Patient aware --Prostate cancer screening: DRE-PSA @ urology 05-2015  -- very active, run-golf --Labs: CMP, FLP, TSH, CBC. Also iron ferritin will be checked, I notice his last hemoglobin was a slightly low.

## 2016-05-11 NOTE — Progress Notes (Signed)
Subjective:    Patient ID: John Harper, male    DOB: July 17, 1960, 56 y.o.   MRN: [id number]  DOS:  05/11/2016 Type of visit - description : cpx Interval history: In general feeling well, he remains very active    Review of Systems Occasional back pain, not a major issue Occasional pain at the right second knuckle without redness, swelling.   Other than above, a 14 point review of systems is negative      Past Medical History:  Diagnosis Date  . Anal fissure   . BPH (benign prostatic hyperplasia)    w/o LUTS  . Erectile dysfunction due to arterial insufficiency   . Hemorrhoids   . 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 Peyronie's disease   . Viral meningitis 2013   had spinal tap.    Past Surgical History:  Procedure Laterality Date  . COLONOSCOPY    . POLYPECTOMY      Social History   Social History  . Marital status: Divorced    Spouse name: N/A  . Number of children: 2  . Years of education: N/A   Occupational History  . sales rep    Social History Main Topics  . Smoking status: Former Smoker    Types: Cigarettes    Quit date: 10/28/2014  . Smokeless tobacco: Never Used     Comment: quit 2010  . Alcohol use Yes     Comment: 10 drinks/week  . Drug use: No  . Sexual activity: Not on file   Other Topics Concern  . Not on file   Social History Narrative   Lives by himself. Twins, boy-girl, live part time w/ pt      Family History  Problem Relation Age of Onset  . Pancreatic cancer Father   . Coronary artery disease Father 66    CABG x2  . Prostate cancer Father 27  . Kidney cancer Father 74    has one kidney  . Stroke Mother 71  . Neuropathy Mother   . Hypertension Brother   . Diabetes Other     aunts and uncles  . Colon cancer Neg Hx   . Stomach cancer Neg Hx   . Esophageal cancer Neg Hx   . Rectal cancer Neg Hx      Allergies as of 05/11/2016   No Known Allergies     Medication List       Accurate as of 05/11/16 11:59 PM.  Always use your most recent med list.          aspirin 81 MG tablet Take 81 mg by mouth daily.   tadalafil 20 MG tablet Commonly known as:  CIALIS Take 20 mg by mouth daily as needed for erectile dysfunction.   valACYclovir 500 MG tablet Commonly known as:  VALTREX Take 1 tablet (500 mg total) by mouth daily.          Objective:   Physical Exam BP 118/68 (BP Location: Left Arm, Patient Position: Sitting, Cuff Size: Small)   Pulse 68   Temp 98.1 F (36.7 C) (Oral)   Resp 14   Ht 5\' 7"  (1.702 m)   Wt 170 lb 6 oz (77.3 kg)   SpO2 97%   BMI 26.68 kg/m   General:   Well developed, well nourished . NAD.  Neck: No  thyromegaly  HEENT:  Normocephalic . Face symmetric, atraumatic Lungs:  CTA B Normal respiratory effort, no intercostal retractions, no accessory muscle use. Heart: RRR,  no  murmur.  No pretibial edema bilaterally  Abdomen:  Not distended, soft, non-tender. No rebound or rigidity.   Skin: Exposed areas without rash. Not pale. Not jaundice MSK: Hands and wrists with mild bony deformities consistent with DJD. No synovitis Neurologic:  alert & oriented X3.  Speech normal, gait appropriate for age and unassisted Strength symmetric and appropriate for age.  Psych: Cognition and judgment appear intact.  Cooperative with normal attention span and concentration.  Behavior appropriate. No anxious or depressed appearing.    Assessment & Plan:   Assessment  Rosacea, has seen derm, failed solantra Urology: -BPH -Peroyne's disease, sees urology -Erectile dysfunction Mollaret's meningitis--Viral meningitis 2012 and 2016 (d/t HSV-2 documented by LP, Rx chronic Valtrex by ID 12/22/2014)  +FH prostate ca, father, age 63s +FH CAD father , age 63s  PLAN: Meningitis: Patient is somewhat concerned about taking Valtrex daily, ID note reviewed, he was recommended chronic Valtrex due to documented HSV meningitis. Treatment does not  guarantees prevention of   Meningitis and w/o treatment he may or may not have another episode . For now I recommend to decrease Valtrex to 500 mg daily and reassess next year. He is in agreement. RTC one year

## 2016-05-12 NOTE — Assessment & Plan Note (Signed)
Meningitis: Patient is somewhat concerned about taking Valtrex daily, ID note reviewed, he was recommended chronic Valtrex due to documented HSV meningitis. Treatment does not  guarantees prevention of  Meningitis and w/o treatment he may or may not have another episode . For now I recommend to decrease Valtrex to 500 mg daily and reassess next year. He is in agreement. RTC one year

## 2016-07-23 ENCOUNTER — Telehealth: Payer: Self-pay | Admitting: Internal Medicine

## 2016-07-23 NOTE — Telephone Encounter (Signed)
Pt dropped off document to be filled out by provider (Physicians Result [employer]). Pt would like to be called when ready to pick up at [facility] [phone].

## 2016-07-23 NOTE — Telephone Encounter (Signed)
Document put at front office tray. °

## 2016-07-24 NOTE — Telephone Encounter (Signed)
Wellness-Physician Results Form completed [called pt for waist circumference], after faxing to [facility], will call patient when his copy is available for p/u; forwarded to provider/SLS 07/17

## 2016-07-25 NOTE — Telephone Encounter (Signed)
Pt returned call. He states that he do not need form and that office can dispose of it because he have a email copy from [facility].    I placed form is shred bend.

## 2016-07-25 NOTE — Telephone Encounter (Signed)
LMOM informing Pt that form has been faxed to [facility] at [phone] and original placed at front desk for pick up at his convenience. Copy of form sent for scanning.

## 2016-12-04 DIAGNOSIS — N400 Enlarged prostate without lower urinary tract symptoms: Secondary | ICD-10-CM | POA: Diagnosis not present

## 2016-12-04 DIAGNOSIS — N486 Induration penis plastica: Secondary | ICD-10-CM | POA: Diagnosis not present

## 2016-12-04 DIAGNOSIS — N5201 Erectile dysfunction due to arterial insufficiency: Secondary | ICD-10-CM | POA: Diagnosis not present

## 2016-12-14 DIAGNOSIS — N486 Induration penis plastica: Secondary | ICD-10-CM | POA: Insufficient documentation

## 2017-01-11 ENCOUNTER — Encounter: Payer: Self-pay | Admitting: Internal Medicine

## 2017-01-21 ENCOUNTER — Encounter: Payer: Self-pay | Admitting: Internal Medicine

## 2017-01-28 ENCOUNTER — Other Ambulatory Visit: Payer: Self-pay

## 2017-01-28 ENCOUNTER — Ambulatory Visit (AMBULATORY_SURGERY_CENTER): Payer: Self-pay | Admitting: *Deleted

## 2017-01-28 VITALS — Ht 67.0 in | Wt 179.0 lb

## 2017-01-28 DIAGNOSIS — Z86010 Personal history of colonic polyps: Secondary | ICD-10-CM

## 2017-01-28 MED ORDER — NA SULFATE-K SULFATE-MG SULF 17.5-3.13-1.6 GM/177ML PO SOLN: ORAL | 0 refills | Status: DC

## 2017-01-28 NOTE — Progress Notes (Signed)
Suprep coupon givenKara Harper: [id number] PCN: [id number]  GROUP: [id number] ID: [id number]  No egg or soy allergy  No trouble moving neck  No home oxygen used or hx of sleep apnea  No diet medications taken  Registered in [facility]

## 2017-01-30 ENCOUNTER — Encounter: Payer: Self-pay | Admitting: Internal Medicine

## 2017-02-07 ENCOUNTER — Encounter: Payer: BLUE CROSS/BLUE SHIELD | Admitting: Internal Medicine

## 2017-03-08 HISTORY — PX: COLONOSCOPY: SHX174

## 2017-03-27 ENCOUNTER — Other Ambulatory Visit: Payer: Self-pay

## 2017-03-27 ENCOUNTER — Ambulatory Visit (AMBULATORY_SURGERY_CENTER): Payer: BLUE CROSS/BLUE SHIELD | Admitting: Internal Medicine

## 2017-03-27 ENCOUNTER — Encounter: Payer: Self-pay | Admitting: Internal Medicine

## 2017-03-27 VITALS — BP 130/69 | HR 52 | Temp 96.0°F | Resp 15 | Ht 67.0 in | Wt 179.0 lb

## 2017-03-27 DIAGNOSIS — Z86010 Personal history of colonic polyps: Secondary | ICD-10-CM

## 2017-03-27 DIAGNOSIS — D125 Benign neoplasm of sigmoid colon: Secondary | ICD-10-CM | POA: Diagnosis not present

## 2017-03-27 DIAGNOSIS — D123 Benign neoplasm of transverse colon: Secondary | ICD-10-CM | POA: Diagnosis not present

## 2017-03-27 MED ORDER — SODIUM CHLORIDE 0.9 % IV SOLN: 500.0000 mL | Freq: Once | INTRAVENOUS | Status: DC

## 2017-03-27 NOTE — Progress Notes (Signed)
Pt's states no medical or surgical changes since previsit or office visit. maw 

## 2017-03-27 NOTE — Progress Notes (Signed)
Report given to PACU, vss 

## 2017-03-27 NOTE — Progress Notes (Signed)
Called to room to assist during endoscopic procedure.  Patient ID and intended procedure confirmed with present staff. Received instructions for my participation in the procedure from the performing physician.  

## 2017-03-27 NOTE — Op Note (Signed)
[place] Patient Name: John Harper Procedure Date: 03/27/2017 8:33 AM MRN: [id number] Endoscopist: Jerene Bears , MD Age: 57 Referring MD:  Date of Birth: February 04, 1960 Gender: Male Account #: [id number] Procedure:                Colonoscopy Indications:              Surveillance: Personal history of adenomatous                            polyps on last colonoscopy 3 years ago Medicines:                Monitored Anesthesia Care Procedure:                Pre-Anesthesia Assessment:                           - Prior to the procedure, a History and Physical                            was performed, and patient medications and                            allergies were reviewed. The patient's tolerance of                            previous anesthesia was also reviewed. The risks                            and benefits of the procedure and the sedation                            options and risks were discussed with the patient.                            All questions were answered, and informed consent                            was obtained. Prior Anticoagulants: The patient has                            taken no previous anticoagulant or antiplatelet                            agents. ASA Grade Assessment: II - A patient with                            mild systemic disease. After reviewing the risks                            and benefits, the patient was deemed in                            satisfactory condition to undergo the procedure.  After obtaining informed consent, the colonoscope                            was passed under direct vision. Throughout the                            procedure, the patient's blood pressure, pulse, and                            oxygen saturations were monitored continuously. The                            Colonoscope was introduced through the anus and                            advanced to the the cecum,  identified by                            appendiceal orifice and ileocecal valve. The                            colonoscopy was performed without difficulty. The                            patient tolerated the procedure well. The quality                            of the bowel preparation was good. The ileocecal                            valve, appendiceal orifice, and rectum were                            photographed. Scope In: 8:38:37 AM Scope Out: 8:57:46 AM Scope Withdrawal Time: 0 hours 16 minutes 15 seconds  Total Procedure Duration: 0 hours 19 minutes 9 seconds  Findings:                 The digital rectal exam was normal.                           A 12 mm polyp was found in the transverse colon.                            The polyp was sessile. The polyp was removed with a                            cold snare. Resection and retrieval were complete.                           Three sessile polyps were found in the transverse                            colon. The polyps were 3 to 4 mm in  size. These                            polyps were removed with a cold snare. Resection                            and retrieval were complete.                           A 2 mm polyp was found in the sigmoid colon. The                            polyp was sessile. The polyp was removed with a                            cold snare. Resection and retrieval were complete.                           Internal hemorrhoids and hypertrophied anal                            papillae were found during retroflexion. The                            hemorrhoids were small. Complications:            No immediate complications. Estimated Blood Loss:     Estimated blood loss was minimal. Impression:               - One 12 mm polyp in the transverse colon, removed                            with a cold snare. Resected and retrieved.                           - Three 3 to 4 mm polyps in the transverse colon,                             removed with a cold snare. Resected and retrieved.                           - One 2 mm polyp in the sigmoid colon, removed with                            a cold snare. Resected and retrieved.                           - Small internal hemorrhoids. Recommendation:           - Patient has a contact number available for                            emergencies. The signs and symptoms of potential  delayed complications were discussed with the                            patient. Return to normal activities tomorrow.                            Written discharge instructions were provided to the                            patient.                           - Resume previous diet.                           - Continue present medications.                           - Await pathology results.                           - Repeat colonoscopy in 3 years for surveillance. Jerene Bears, MD 03/27/2017 9:03:03 AM This report has been signed electronically.

## 2017-03-27 NOTE — Patient Instructions (Addendum)
   YOU SHOULD EXPECT: Some feelings of bloating in the abdomen. Passage of more gas than usual.  Walking can help get rid of the air that was put into your GI tract during the procedure and reduce the bloating. If you had a lower endoscopy (such as a colonoscopy or flexible sigmoidoscopy) you may notice spotting of blood in your stool or on the toilet paper. If you underwent a bowel prep for your procedure, you may not have a normal bowel movement for a few days.  Please Note:  You might notice some irritation and congestion in your nose or some drainage.  This is from the oxygen used during your procedure.  There is no need for concern and it should clear up in a day or so.  SYMPTOMS TO REPORT IMMEDIATELY:   Following lower endoscopy (colonoscopy or flexible sigmoidoscopy):  Excessive amounts of blood in the stool  Significant tenderness or worsening of abdominal pains  Swelling of the abdomen that is new, acute  Fever of 100F or higher  Please see handouts given to you on Polyps and Hemorrhoids.  For urgent or emergent issues, a gastroenterologist can be reached at any hour by calling [phone].   DIET:  We do recommend a small meal at first, but then you may proceed to your regular diet.  Drink plenty of fluids but you should avoid alcoholic beverages for 24 hours.  ACTIVITY:  You should plan to take it easy for the rest of today and you should NOT DRIVE or use heavy machinery until tomorrow (because of the sedation medicines used during the test).    FOLLOW UP: Our staff will call the number listed on your records the next business day following your procedure to check on you and address any questions or concerns that you may have regarding the information given to you following your procedure. If we do not reach you, we will leave a message.  However, if you are feeling well and you are not experiencing any problems, there is no need to return our call.  We will assume that you  have returned to your regular daily activities without incident.  If any biopsies were taken you will be contacted by phone or by letter within the next 1-3 weeks.  Please call us at [phone] if you have not heard about the biopsies in 3 weeks.    SIGNATURES/CONFIDENTIALITY: You and/or your care partner have signed paperwork which will be entered into your electronic medical record.  These signatures attest to the fact that that the information above on your After Visit Summary has been reviewed and is understood.  Full responsibility of the confidentiality of this discharge information lies with you and/or your care-partner.  Thank you for letting us take care of your healthcare needs today.

## 2017-03-28 ENCOUNTER — Telehealth: Payer: Self-pay

## 2017-03-28 NOTE — Telephone Encounter (Signed)
  Follow up Call-  Call back number 03/27/2017  Post procedure Call Back phone  # [phone] cell  Permission to leave phone message Yes  Some recent data might be hidden     Left message

## 2017-03-28 NOTE — Telephone Encounter (Signed)
  Follow up Call-  Call back number 03/27/2017  Post procedure Call Back phone  # [phone] cell  Permission to leave phone message Yes  Some recent data might be hidden     Left message

## 2017-04-03 ENCOUNTER — Encounter: Payer: Self-pay | Admitting: Internal Medicine

## 2017-05-22 ENCOUNTER — Other Ambulatory Visit: Payer: Self-pay | Admitting: Internal Medicine

## 2017-05-22 ENCOUNTER — Telehealth: Payer: Self-pay | Admitting: Internal Medicine

## 2017-05-22 NOTE — Telephone Encounter (Signed)
Copied from [place] [id number]. Topic: Quick Communication - See Telephone Encounter >> May 22, 2017  1:47 PM Ether Griffins B wrote: CRM for notification. See Telephone encounter for: 05/22/17.  Pt wants to know why he needs to come in for an appt to get a refill on valACYclovir (VALTREX) 500 MG tablet.

## 2017-05-22 NOTE — Telephone Encounter (Signed)
Called pt and informed him that he would need a CPE to get any further refills. Scheduled CPE for 06/14/17

## 2017-05-22 NOTE — Telephone Encounter (Signed)
Pt due for CPE- last OV 05/11/2016, a 30 day supply was sent today.

## 2017-06-14 ENCOUNTER — Ambulatory Visit (INDEPENDENT_AMBULATORY_CARE_PROVIDER_SITE_OTHER): Payer: BLUE CROSS/BLUE SHIELD | Admitting: Internal Medicine

## 2017-06-14 ENCOUNTER — Encounter: Payer: Self-pay | Admitting: Internal Medicine

## 2017-06-14 VITALS — BP 130/74 | HR 68 | Temp 97.6°F | Resp 16 | Ht 67.0 in | Wt 167.0 lb

## 2017-06-14 DIAGNOSIS — Z0000 Encounter for general adult medical examination without abnormal findings: Secondary | ICD-10-CM

## 2017-06-14 DIAGNOSIS — B003 Herpesviral meningitis: Secondary | ICD-10-CM | POA: Diagnosis not present

## 2017-06-14 LAB — BASIC METABOLIC PANEL
BUN: 15 mg/dL (ref 6–23)
CO2: 30 mEq/L (ref 19–32)
Calcium: 9.3 mg/dL (ref 8.4–10.5)
Chloride: 103 mEq/L (ref 96–112)
Creatinine, Ser: 0.99 mg/dL (ref 0.40–1.50)
GFR: 82.7 mL/min (ref >60.00)
Glucose, Bld: 95 mg/dL (ref 70–99)
Potassium: 4 mEq/L (ref 3.5–5.1)
Sodium: 140 mEq/L (ref 135–145)

## 2017-06-14 LAB — LIPID PANEL
Cholesterol: 175 mg/dL (ref 0–200)
HDL: 57.1 mg/dL (ref >39.00)
LDL Cholesterol: 93 mg/dL (ref 0–99)
NonHDL: 117.87
Total CHOL/HDL Ratio: 3
Triglycerides: 122 mg/dL (ref 0.0–149.0)
VLDL: 24.4 mg/dL (ref 0.0–40.0)

## 2017-06-14 LAB — CBC
HCT: 46.8 % (ref 39.0–52.0)
Hemoglobin: 15.6 g/dL (ref 13.0–17.0)
MCHC: 33.3 g/dL (ref 30.0–36.0)
MCV: 98.1 fl (ref 78.0–100.0)
Platelets: 217 10*3/uL (ref 150.0–400.0)
RBC: 4.77 Mil/uL (ref 4.22–5.81)
RDW: 13.7 % (ref 11.5–15.5)
WBC: 6.6 10*3/uL (ref 4.0–10.5)

## 2017-06-14 MED ORDER — TADALAFIL 20 MG PO TABS: 20.0000 mg | ORAL_TABLET | Freq: Every day | ORAL | 6 refills | Status: DC | PRN

## 2017-06-14 MED ORDER — VALACYCLOVIR HCL 500 MG PO TABS: 500.0000 mg | ORAL_TABLET | Freq: Every day | ORAL | 3 refills | Status: DC

## 2017-06-14 NOTE — Assessment & Plan Note (Addendum)
--  Td 05-2016 +FH prostate ca, father, age 49s +FH CAD father , age 53s --CCS: CCS: cscope #2---12-2013, Cscope again 03/2017 , next 3 years --Prostate cancer screening: sees urology  , OV 11-2016, had a PSA -- very active, run-golf --Labs: bmp,flp,cbc --lung ca screening, interested, will call when ready

## 2017-06-14 NOTE — Progress Notes (Signed)
Subjective:    Patient ID: John Harper, male    DOB: August 30, 1960, 57 y.o.   MRN: [id number]  DOS:  06/14/2017 Type of visit - description : cpx Interval history: no concerns   Review of Systems A 14 point review of systems is negative    Past Medical History:  Diagnosis Date  . Anal fissure   . BPH (benign prostatic hyperplasia)    w/o LUTS  . Erectile dysfunction due to arterial insufficiency   . Hemorrhoids   . 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 Peyronie's disease   . Viral meningitis 2013   had spinal tap.    Past Surgical History:  Procedure Laterality Date  . COLONOSCOPY    . POLYPECTOMY      Social History   Socioeconomic History  . Marital status: Divorced    Spouse name: Not on file  . Number of children: 2  . Years of education: Not on file  . Highest education level: Not on file  Occupational History  . Occupation: Biochemist, clinical  Social Needs  . Financial resource strain: Not on file  . Food insecurity:    Worry: Not on file    Inability: Not on file  . Transportation needs:    Medical: Not on file    Non-medical: Not on file  Tobacco Use  . Smoking status: Former Smoker    Types: Cigarettes    Last attempt to quit: 10/28/2014    Years since quitting: 2.6  . Smokeless tobacco: Never Used  . Tobacco comment: quit 2010 ( 1 ppd x 32 years from 1978 to 2010))  Substance and Sexual Activity  . Alcohol use: Yes    Comment: 6 beer per week approx  . Drug use: No  . Sexual activity: Not on file  Lifestyle  . Physical activity:    Days per week: Not on file    Minutes per session: Not on file  . Stress: Not on file  Relationships  . Social connections:    Talks on phone: Not on file    Gets together: Not on file    Attends religious service: Not on file    Active member of club or organization: Not on file    Attends meetings of clubs or organizations: Not on file    Relationship status: Not on file  . Intimate partner violence:    Fear  of current or ex partner: Not on file    Emotionally abused: Not on file    Physically abused: Not on file    Forced sexual activity: Not on file  Other Topics Concern  . Not on file  Social History Narrative   Lives by himself. Twins, boy-girl, live part time w/ pt      Family History  Problem Relation Age of Onset  . Pancreatic cancer Father   . Coronary artery disease Father 39       CABG x2  . Prostate cancer Father 72  . Kidney cancer Father 6       has one kidney  . Stroke Mother 15  . Neuropathy Mother   . Hypertension Brother   . Diabetes Other        aunts and uncles  . Colon cancer Neg Hx   . Stomach cancer Neg Hx   . Esophageal cancer Neg Hx   . Rectal cancer Neg Hx   . Liver cancer Neg Hx      Allergies as of 06/14/2017  No Known Allergies     Medication List        Accurate as of 06/14/17 11:59 PM. Always use your most recent med list.          aspirin 81 MG tablet Take 81 mg by mouth daily.   tadalafil 20 MG tablet Commonly known as:  ADCIRCA/CIALIS Take 1 tablet (20 mg total) by mouth daily as needed for erectile dysfunction.   valACYclovir 500 MG tablet Commonly known as:  VALTREX Take 1 tablet (500 mg total) by mouth daily.          Objective:   Physical Exam BP 130/74 (BP Location: Right Arm, Patient Position: Sitting, Cuff Size: Small)   Pulse 68   Temp 97.6 F (36.4 C) (Oral)   Resp 16   Ht 5\' 7"  (1.702 m)   Wt 167 lb (75.8 kg)   SpO2 98%   BMI 26.16 kg/m  General:   Well developed, well nourished . NAD.  Neck: No  thyromegaly  HEENT:  Normocephalic . Face symmetric, atraumatic Lungs:  CTA B Normal respiratory effort, no intercostal retractions, no accessory muscle use. Heart: RRR,  no murmur.  No pretibial edema bilaterally  Abdomen:  Not distended, soft, non-tender. No rebound or rigidity.   Skin: Exposed areas without rash. Not pale. Not jaundice Neurologic:  alert & oriented X3.  Speech normal, gait appropriate  for age and unassisted Strength symmetric and appropriate for age.  Psych: Cognition and judgment appear intact.  Cooperative with normal attention span and concentration.  Behavior appropriate. No anxious or depressed appearing.     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  Issued. BPH: Sees urology as needed Mollaret's meningitis: Refill Valtrex for a year RTC 1 year

## 2017-06-14 NOTE — Patient Instructions (Addendum)
GO TO THE LAB : Get the blood work     GO TO THE FRONT DESK Schedule your next appointment for a  Physical exam in 1 year 

## 2017-06-16 NOTE — Assessment & Plan Note (Signed)
ED: Request a prescription for Cialis.  Issued. BPH: Sees urology as needed Mollaret's meningitis: Refill Valtrex for a year RTC 1 year

## 2017-08-20 IMAGING — CT CT HEAD W/O CM
2 series · 16 of 30 positions shown, 20 images · non-contrast
Comparison: None.

CLINICAL DATA: Patient reports a history of viral meniningitis 3
years ago. Patiaent reports that he had a headache yesterday and
today the headache is [DATE], slight stiffness in his neck and
weakness.

EXAM:
CT HEAD WITHOUT CONTRAST
TECHNIQUE: Contiguous axial images were obtained from the base of the skull
through the vertex without intravenous contrast.

[Series 2: head w/o · axial · non-contrast · 0.45mm/px · z∈[-132,-12]mm · 13 of 29 slices shown, 17 images]
[im 3/29  brain]
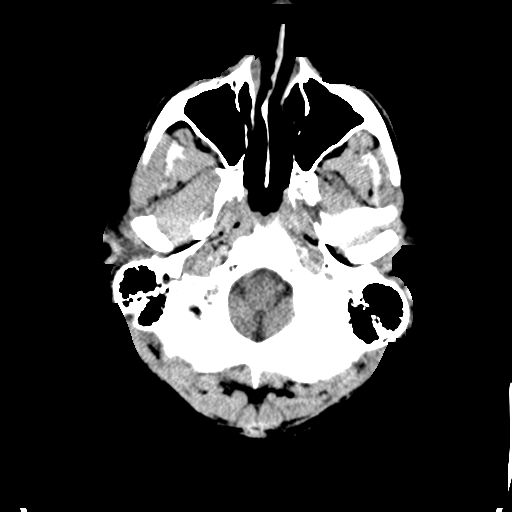
[im 3/29  bone]
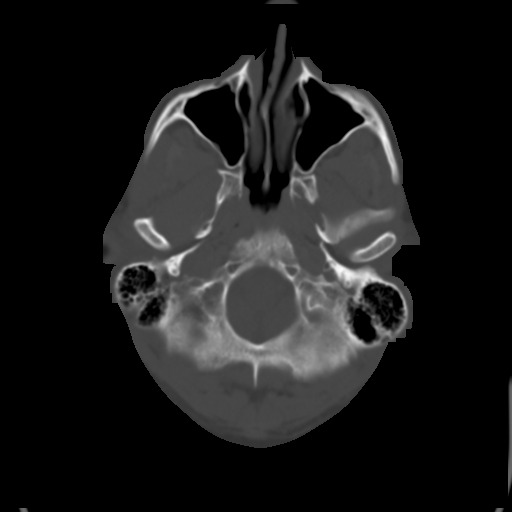
[im 5/29  brain]
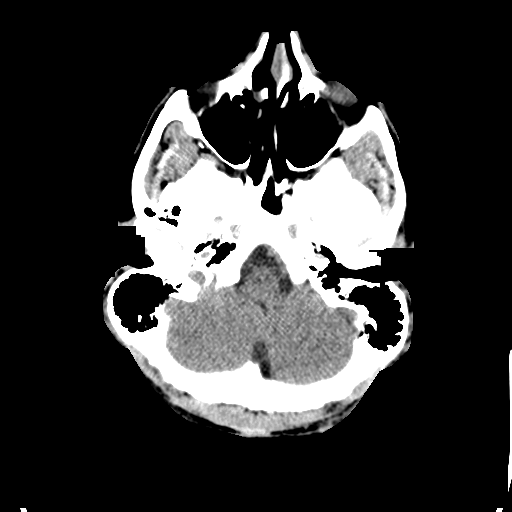
[im 7/29  brain]
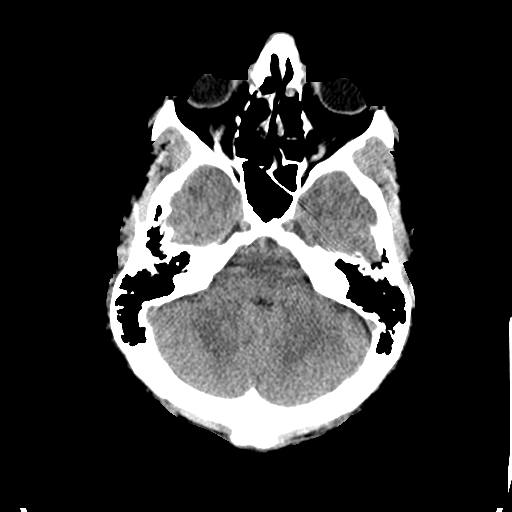
[im 9/29  brain]
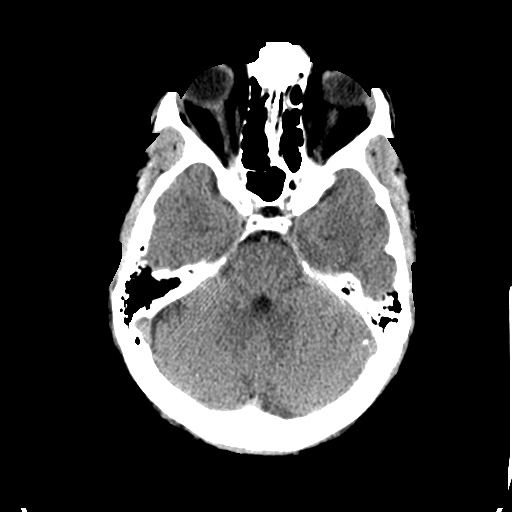
[im 11/29  brain]
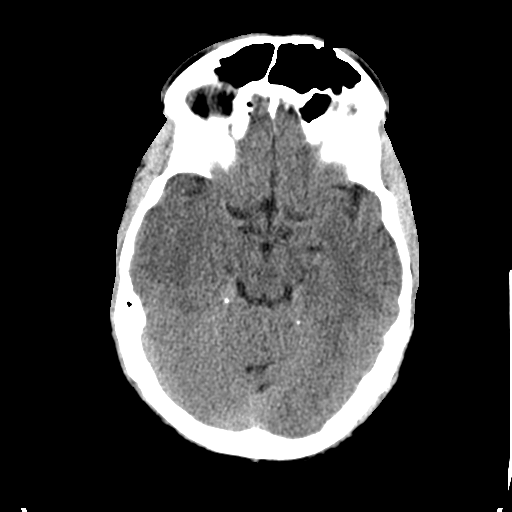
[im 11/29  bone]
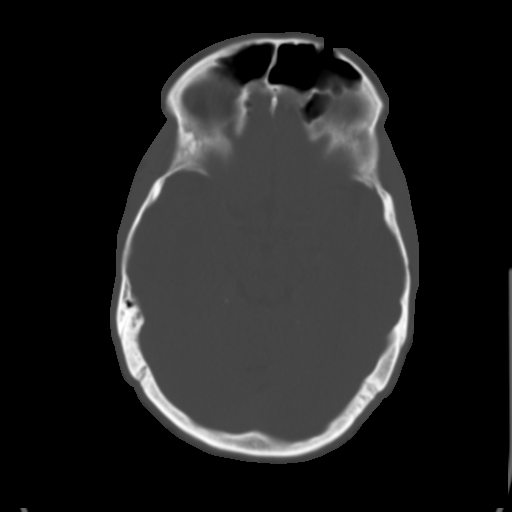
[im 13/29  brain]
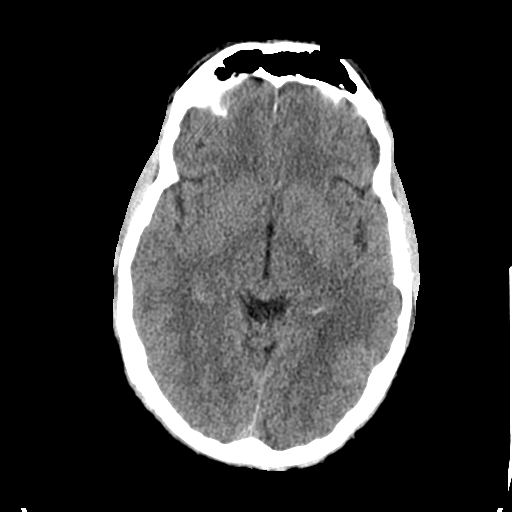
[im 15/29  brain]
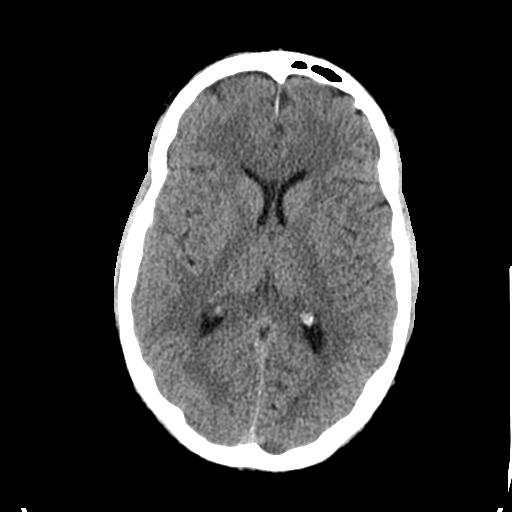
[im 17/29  brain]
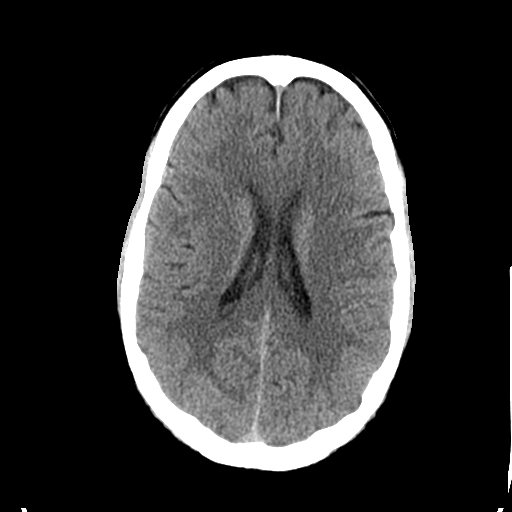
[im 19/29  brain]
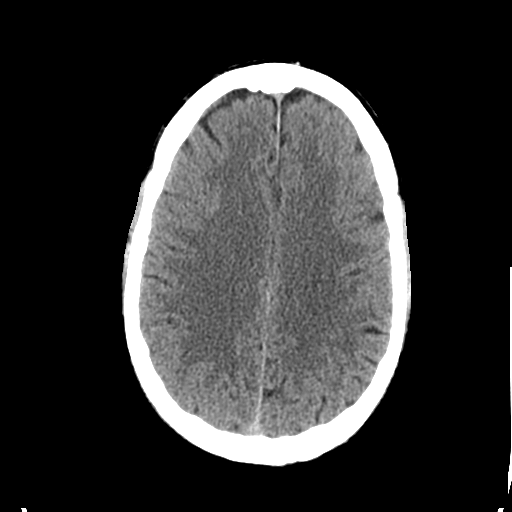
[im 19/29  bone]
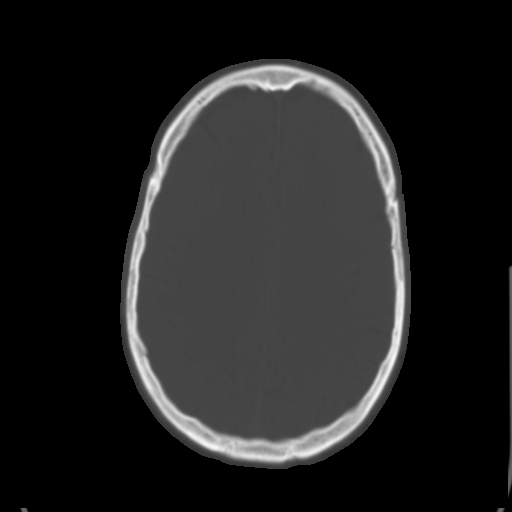
[im 21/29  brain]
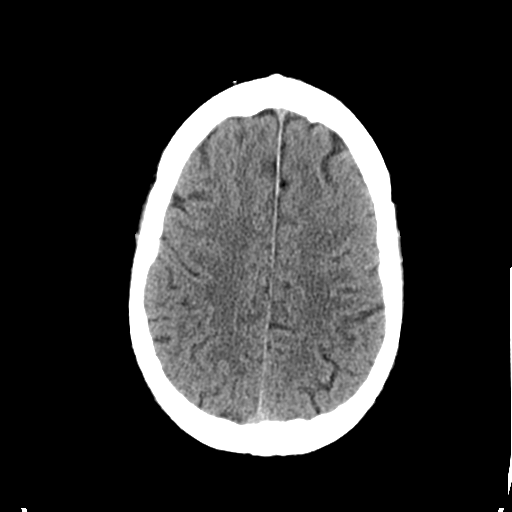
[im 23/29  brain]
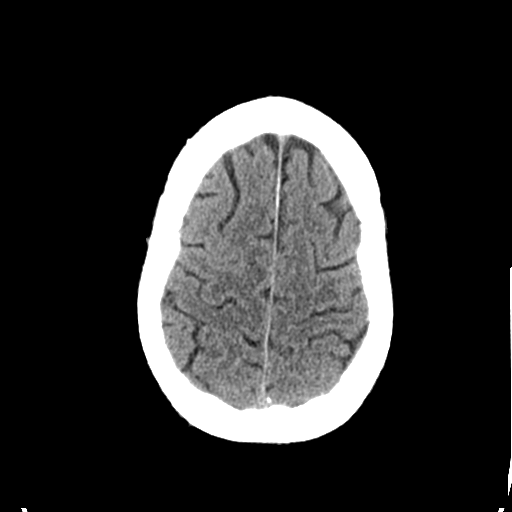
[im 25/29  brain]
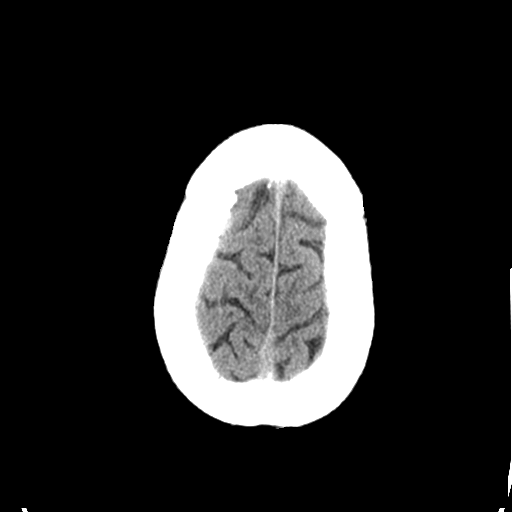
[im 27/29  brain]
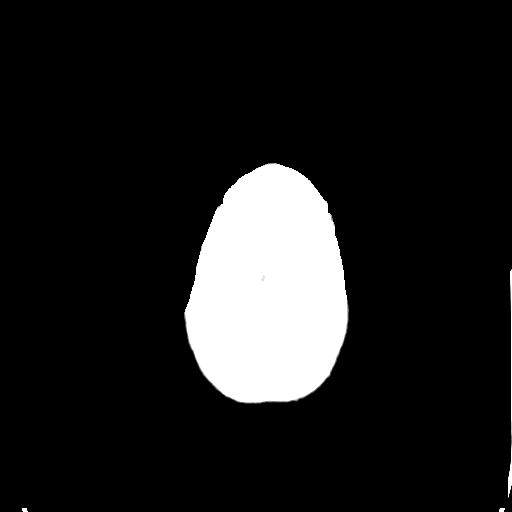
[im 27/29  bone]
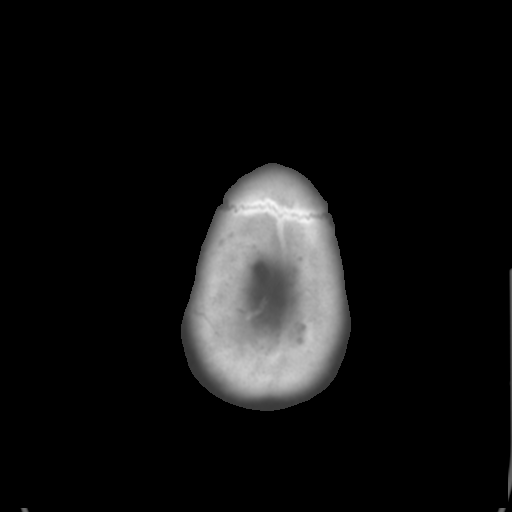

[Series 3: bone windows · axial · 0.45mm/px · z∈[-132,-92]mm · 3 of 29 slices shown]
[im 3/29  bone]
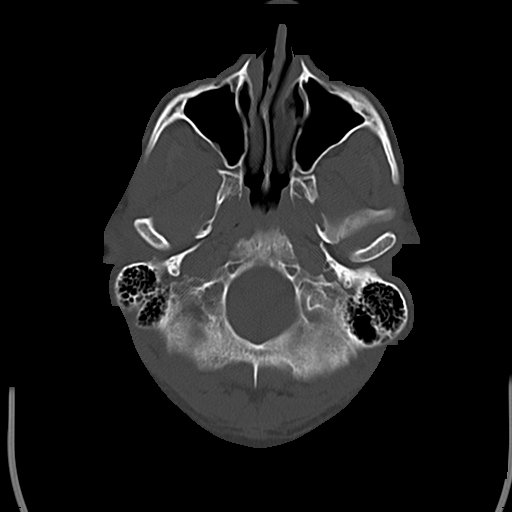
[im 7/29  bone]
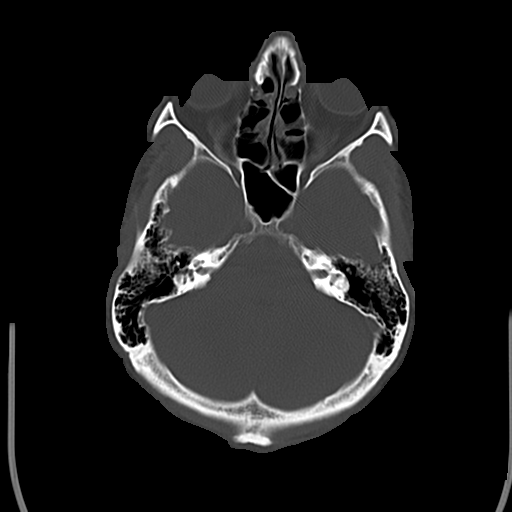
[im 11/29  bone]
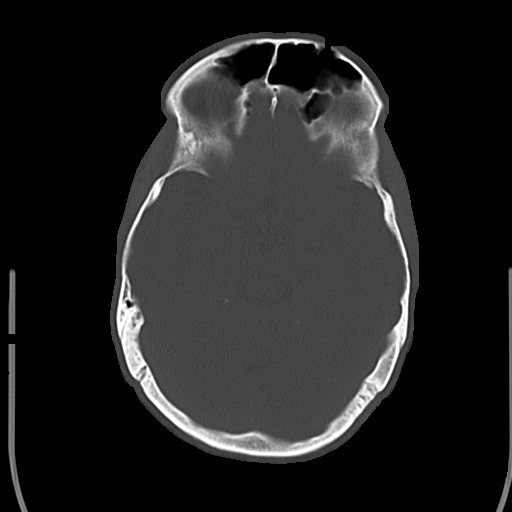

[16 of 30 positions shown; findings below may reference images not displayed]

FINDINGS: The ventricles are normal in size and configuration. There are no
parenchymal masses or mass effect. There is no evidence of an
infarct. There are no extra-axial masses or abnormal fluid
collections.

There is no intracranial hemorrhage.

There is mild maxillary sinus mucosal thickening. Small mucous
retention cysts and minor mucosal thickening is noted along the
maxillary sinuses. Clear mastoid air cells. No skull lesion.
IMPRESSION: 1. No intracranial abnormality.
2. Mild sinus mucosal thickening.

## 2018-03-14 ENCOUNTER — Other Ambulatory Visit: Payer: Self-pay | Admitting: Internal Medicine

## 2018-03-14 DIAGNOSIS — B003 Herpesviral meningitis: Secondary | ICD-10-CM

## 2018-03-14 MED ORDER — VALACYCLOVIR HCL 500 MG PO TABS: 500.0000 mg | ORAL_TABLET | Freq: Every day | ORAL | 0 refills | Status: DC

## 2018-03-14 NOTE — Telephone Encounter (Signed)
Copied from [place] [id number]. Topic: Quick Communication - Rx Refill/Question >> Mar 14, 2018  9:31 AM Burchel, Abbi R wrote: Medication: valACYclovir (VALTREX) 500 MG tablet  Preferred Pharmacy: [facility] [place], [address] [place] [address] Phone: [phone] Fax: [phone]   Pt was  advised that RX refills may take up to 3 business days. We ask that you follow-up with your pharmacy.

## 2018-03-14 NOTE — Telephone Encounter (Signed)
Pt has upcoming appt in 3 months Requested Prescriptions  Pending Prescriptions Disp Refills  . valACYclovir (VALTREX) 500 MG tablet 90 tablet 0    Sig: Take 1 tablet (500 mg total) by mouth daily.     Antimicrobials:  Antiviral Agents - Anti-Herpetic Passed - 03/14/2018 10:17 AM      Passed - Valid encounter within last 12 months    Recent Outpatient Visits          9 months ago Annual physical exam   [facility] [place], [place]   1 year ago Annual physical exam   [facility], [place]   3 years ago Annual physical exam   [facility] [place], MD   4 years ago Skin lesion- rash (?rosacea)   [facility], MD      Future Appointments            In 3 months Larose Kells, Alda Berthold, MD [facility] [place]

## 2018-06-20 ENCOUNTER — Encounter: Payer: Self-pay | Admitting: Internal Medicine

## 2018-06-20 ENCOUNTER — Other Ambulatory Visit: Payer: Self-pay

## 2018-06-20 ENCOUNTER — Ambulatory Visit (INDEPENDENT_AMBULATORY_CARE_PROVIDER_SITE_OTHER): Payer: BC Managed Care – PPO | Admitting: Internal Medicine

## 2018-06-20 VITALS — BP 138/76 | HR 71 | Temp 98.2°F | Resp 16 | Ht 67.0 in | Wt 176.0 lb

## 2018-06-20 DIAGNOSIS — G032 Benign recurrent meningitis [Mollaret]: Secondary | ICD-10-CM

## 2018-06-20 DIAGNOSIS — Z0000 Encounter for general adult medical examination without abnormal findings: Secondary | ICD-10-CM

## 2018-06-20 DIAGNOSIS — N486 Induration penis plastica: Secondary | ICD-10-CM

## 2018-06-20 DIAGNOSIS — B003 Herpesviral meningitis: Secondary | ICD-10-CM | POA: Diagnosis not present

## 2018-06-20 LAB — CBC WITH DIFFERENTIAL/PLATELET
Basophils Absolute: 0.1 10*3/uL (ref 0.0–0.1)
Basophils Relative: 1.1 % (ref 0.0–3.0)
Eosinophils Absolute: 0.4 10*3/uL (ref 0.0–0.7)
Eosinophils Relative: 6.3 % — ABNORMAL HIGH (ref 0.0–5.0)
HCT: 46.5 % (ref 39.0–52.0)
Hemoglobin: 15.5 g/dL (ref 13.0–17.0)
Lymphocytes Relative: 13.8 % (ref 12.0–46.0)
Lymphs Abs: 0.9 10*3/uL (ref 0.7–4.0)
MCHC: 33.2 g/dL (ref 30.0–36.0)
MCV: 98 fl (ref 78.0–100.0)
Monocytes Absolute: 0.5 10*3/uL (ref 0.1–1.0)
Monocytes Relative: 7.4 % (ref 3.0–12.0)
Neutro Abs: 4.6 10*3/uL (ref 1.4–7.7)
Neutrophils Relative %: 71.4 % (ref 43.0–77.0)
Platelets: 200 10*3/uL (ref 150.0–400.0)
RBC: 4.75 Mil/uL (ref 4.22–5.81)
RDW: 13.5 % (ref 11.5–15.5)
WBC: 6.4 10*3/uL (ref 4.0–10.5)

## 2018-06-20 LAB — LIPID PANEL
Cholesterol: 187 mg/dL (ref 0–200)
HDL: 61.5 mg/dL (ref >39.00)
LDL Cholesterol: 105 mg/dL — ABNORMAL HIGH (ref 0–99)
NonHDL: 125.85
Total CHOL/HDL Ratio: 3
Triglycerides: 105 mg/dL (ref 0.0–149.0)
VLDL: 21 mg/dL (ref 0.0–40.0)

## 2018-06-20 LAB — COMPREHENSIVE METABOLIC PANEL
ALT: 17 U/L (ref 0–53)
AST: 17 U/L (ref 0–37)
Albumin: 4.2 g/dL (ref 3.5–5.2)
Alkaline Phosphatase: 59 U/L (ref 39–117)
BUN: 14 mg/dL (ref 6–23)
CO2: 28 mEq/L (ref 19–32)
Calcium: 9.3 mg/dL (ref 8.4–10.5)
Chloride: 105 mEq/L (ref 96–112)
Creatinine, Ser: 1.01 mg/dL (ref 0.40–1.50)
GFR: 75.77 mL/min (ref >60.00)
Glucose, Bld: 90 mg/dL (ref 70–99)
Potassium: 4.4 mEq/L (ref 3.5–5.1)
Sodium: 140 mEq/L (ref 135–145)
Total Bilirubin: 0.7 mg/dL (ref 0.2–1.2)
Total Protein: 6.4 g/dL (ref 6.0–8.3)

## 2018-06-20 LAB — PSA: PSA: 1.02 ng/mL (ref 0.10–4.00)

## 2018-06-20 MED ORDER — VALACYCLOVIR HCL 500 MG PO TABS: 500.0000 mg | ORAL_TABLET | Freq: Every day | ORAL | 3 refills | Status: DC

## 2018-06-20 NOTE — Progress Notes (Signed)
Subjective:    Patient ID: John Harper, male    DOB: [birth year], 58 y.o.   MRN: [id number]  DOS:  06/20/2018 Type of visit - description: cpx In general feels well, no concerns.  Review of Systems  A 14 point review of systems is negative   Past Medical History:  Diagnosis Date  . Anal fissure   . BPH (benign prostatic hyperplasia)    w/o LUTS  . Erectile dysfunction due to arterial insufficiency   . Hemorrhoids   . 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 Peyronie's disease   . Viral meningitis 2013   had spinal tap.    Past Surgical History:  Procedure Laterality Date  . COLONOSCOPY    . POLYPECTOMY      Social History   Socioeconomic History  . Marital status: Divorced    Spouse name: Not on file  . Number of children: 2  . Years of education: Not on file  . Highest education level: Not on file  Occupational History  . Occupation: Press photographer, has his own [employer]  . Financial resource strain: Not on file  . Food insecurity    Worry: Not on file    Inability: Not on file  . Transportation needs    Medical: Not on file    Non-medical: Not on file  Tobacco Use  . Smoking status: Former Smoker    Types: Cigarettes    Quit date: 10/27/2008    Years since quitting: 9.6  . Smokeless tobacco: Never Used  . Tobacco comment: quit 2010 ( 1 ppd x 32 years from 1978 to 2010))  Substance and Sexual Activity  . Alcohol use: Yes    Comment: 6 beer per week approx  . Drug use: No  . Sexual activity: Not on file  Lifestyle  . Physical activity    Days per week: Not on file    Minutes per session: Not on file  . Stress: Not on file  Relationships  . Social Herbalist on phone: Not on file    Gets together: Not on file    Attends religious service: Not on file    Active member of club or organization: Not on file    Attends meetings of clubs or organizations: Not on file    Relationship status: Not on file  . Intimate partner violence     Fear of current or ex partner: Not on file    Emotionally abused: Not on file    Physically abused: Not on file    Forced sexual activity: Not on file  Other Topics Concern  . Not on file  Social History Narrative   Lives by himself. Twins, boy-girl, live part time w/ pt      Family History  Problem Relation Age of Onset  . Pancreatic cancer Father   . Coronary artery disease Father 33       CABG x2  . Prostate cancer Father 63  . Kidney cancer Father 42       has one kidney  . Stroke Mother 28  . Neuropathy Mother   . Hypertension Brother   . Diabetes Other        aunts and uncles  . Colon cancer Neg Hx   . Stomach cancer Neg Hx   . Esophageal cancer Neg Hx   . Rectal cancer Neg Hx   . Liver cancer Neg Hx      Allergies as of 06/20/2018  No Known Allergies     Medication List       Accurate as of June 20, 2018 11:59 PM. If you have any questions, ask your nurse or doctor.        aspirin 81 MG tablet Take 81 mg by mouth daily.   tadalafil 20 MG tablet Commonly known as: CIALIS Take 1 tablet (20 mg total) by mouth daily as needed for erectile dysfunction.   valACYclovir 500 MG tablet Commonly known as: VALTREX Take 1 tablet (500 mg total) by mouth daily.           Objective:   Physical Exam BP 138/76 (BP Location: Left Arm, Patient Position: Sitting, Cuff Size: Small)   Pulse 71   Temp 98.2 F (36.8 C) (Oral)   Resp 16   Ht 5\' 7"  (1.702 m)   Wt 176 lb (79.8 kg)   SpO2 98%   BMI 27.57 kg/m  General: Well developed, NAD, BMI noted Neck: No  thyromegaly  HEENT:  Normocephalic . Face symmetric, atraumatic Lungs:  CTA B Normal respiratory effort, no intercostal retractions, no accessory muscle use. Heart: RRR,  no murmur.  No pretibial edema bilaterally  Abdomen:  Not distended, soft, non-tender. No rebound or rigidity.   GU: Penis has firm plaque at the dorsum.  No deformities. Scrotum: Normal testicles to palpation DRE: No stools found,  normal sphincter tone, prostate without enlargement, nodularity or tenderness. Skin: Exposed areas without rash. Not pale. Not jaundice Neurologic:  alert & oriented X3.  Speech normal, gait appropriate for age and unassisted Strength symmetric and appropriate for age.  Psych: Cognition and judgment appear intact.  Cooperative with normal attention span and concentration.  Behavior appropriate. No anxious or depressed appearing.     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  He is not planning to go back to urology for now  since basically he does not have any problems or sx from this condition and self-examination penis is the same. ED: We will call for refill when needed Mollaret's meningitis: Refill Valtrex RTC 1 year

## 2018-06-20 NOTE — Assessment & Plan Note (Addendum)
--  Td 05-2016; strongly rec flu shot early this year +FH prostate ca, father, age 74s.  +FH CAD father , age 87s.  He is very active, asymptomatic, last EKG 2016, next 2021. --CCS: CCS: cscope #2---12-2013, Cscope again 03/2017 , next 3 years --Prostate cancer screening: DRE today normal, checking a PSA.  See comments under "peyronie's Dz". -- very active, exercises x 4/week  --Labs: cmp flp cbc psa --lung ca screening discussed, pros and cons, will call if interested

## 2018-06-20 NOTE — Patient Instructions (Signed)
Get the blood work     [place] Schedule your next appointment     Think about his lung cancer screening test, it is a CAT scan

## 2018-06-20 NOTE — Progress Notes (Signed)
Pre visit review using our clinic review tool, if applicable. No additional management support is needed unless otherwise documented below in the visit note. 

## 2018-06-22 NOTE — Assessment & Plan Note (Addendum)
  BPH: Currently denies symptoms Peyronie's disease: Last visit with urology was 2018, he was told about treatment options.  He is not planning to go back to urology for now  since basically he does not have any problems or sx from this condition and self-examination penis is the same. ED: We will call for refill when needed Mollaret's meningitis: Refill Valtrex RTC 1 year

## 2018-06-27 ENCOUNTER — Other Ambulatory Visit: Payer: Self-pay | Admitting: Internal Medicine

## 2018-06-27 MED ORDER — TADALAFIL 20 MG PO TABS: 20.0000 mg | ORAL_TABLET | Freq: Every day | ORAL | 6 refills | Status: DC | PRN

## 2018-06-27 NOTE — Telephone Encounter (Signed)
Medication: tadalafil (ADCIRCA/CIALIS) 20 MG tablet  Has the patient contacted their pharmacy? yes   Preferred Pharmacy (with phone number or street name):  [facility], [address] [phone] (Phone) [phone] (Fax)   Agent: Please be advised that RX refills may take up to 3 business days. We ask that you follow-up with your pharmacy.

## 2018-06-27 NOTE — Telephone Encounter (Signed)
Rx sent 

## 2019-06-13 ENCOUNTER — Other Ambulatory Visit: Payer: Self-pay | Admitting: Internal Medicine

## 2019-06-13 DIAGNOSIS — B003 Herpesviral meningitis: Secondary | ICD-10-CM

## 2019-06-22 ENCOUNTER — Encounter: Payer: Self-pay | Admitting: Internal Medicine

## 2019-06-22 ENCOUNTER — Other Ambulatory Visit: Payer: Self-pay

## 2019-06-22 ENCOUNTER — Telehealth: Payer: Self-pay | Admitting: Internal Medicine

## 2019-06-22 ENCOUNTER — Ambulatory Visit (INDEPENDENT_AMBULATORY_CARE_PROVIDER_SITE_OTHER): Payer: BC Managed Care – PPO | Admitting: Internal Medicine

## 2019-06-22 VITALS — BP 120/78 | HR 74 | Temp 97.4°F | Resp 16 | Ht 67.0 in | Wt 176.1 lb

## 2019-06-22 DIAGNOSIS — Z0000 Encounter for general adult medical examination without abnormal findings: Secondary | ICD-10-CM | POA: Diagnosis not present

## 2019-06-22 DIAGNOSIS — Z8249 Family history of ischemic heart disease and other diseases of the circulatory system: Secondary | ICD-10-CM

## 2019-06-22 DIAGNOSIS — B003 Herpesviral meningitis: Secondary | ICD-10-CM | POA: Diagnosis not present

## 2019-06-22 LAB — COMPREHENSIVE METABOLIC PANEL
ALT: 13 U/L (ref 0–53)
AST: 16 U/L (ref 0–37)
Albumin: 4.1 g/dL (ref 3.5–5.2)
Alkaline Phosphatase: 62 U/L (ref 39–117)
BUN: 15 mg/dL (ref 6–23)
CO2: 30 mEq/L (ref 19–32)
Calcium: 9.1 mg/dL (ref 8.4–10.5)
Chloride: 104 mEq/L (ref 96–112)
Creatinine, Ser: 0.97 mg/dL (ref 0.40–1.50)
GFR: 79.11 mL/min (ref >60.00)
Glucose, Bld: 103 mg/dL — ABNORMAL HIGH (ref 70–99)
Potassium: 4.1 mEq/L (ref 3.5–5.1)
Sodium: 138 mEq/L (ref 135–145)
Total Bilirubin: 0.7 mg/dL (ref 0.2–1.2)
Total Protein: 6.3 g/dL (ref 6.0–8.3)

## 2019-06-22 LAB — LIPID PANEL
Cholesterol: 171 mg/dL (ref 0–200)
HDL: 57 mg/dL (ref >39.00)
LDL Cholesterol: 91 mg/dL (ref 0–99)
NonHDL: 114.46
Total CHOL/HDL Ratio: 3
Triglycerides: 116 mg/dL (ref 0.0–149.0)
VLDL: 23.2 mg/dL (ref 0.0–40.0)

## 2019-06-22 LAB — CBC WITH DIFFERENTIAL/PLATELET
Basophils Absolute: 0 10*3/uL (ref 0.0–0.1)
Basophils Relative: 0.7 % (ref 0.0–3.0)
Eosinophils Absolute: 0.3 10*3/uL (ref 0.0–0.7)
Eosinophils Relative: 5.6 % — ABNORMAL HIGH (ref 0.0–5.0)
HCT: 43.9 % (ref 39.0–52.0)
Hemoglobin: 14.9 g/dL (ref 13.0–17.0)
Lymphocytes Relative: 13.2 % (ref 12.0–46.0)
Lymphs Abs: 0.8 10*3/uL (ref 0.7–4.0)
MCHC: 33.9 g/dL (ref 30.0–36.0)
MCV: 96.8 fl (ref 78.0–100.0)
Monocytes Absolute: 0.5 10*3/uL (ref 0.1–1.0)
Monocytes Relative: 7.5 % (ref 3.0–12.0)
Neutro Abs: 4.6 10*3/uL (ref 1.4–7.7)
Neutrophils Relative %: 73 % (ref 43.0–77.0)
Platelets: 191 10*3/uL (ref 150.0–400.0)
RBC: 4.54 Mil/uL (ref 4.22–5.81)
RDW: 13.2 % (ref 11.5–15.5)
WBC: 6.2 10*3/uL (ref 4.0–10.5)

## 2019-06-22 LAB — PSA: PSA: 0.8 ng/mL (ref 0.10–4.00)

## 2019-06-22 LAB — TSH: TSH: 0.8 u[IU]/mL (ref 0.35–4.50)

## 2019-06-22 MED ORDER — VALACYCLOVIR HCL 500 MG PO TABS: 500.0000 mg | ORAL_TABLET | Freq: Every day | ORAL | 3 refills | Status: DC

## 2019-06-22 NOTE — Telephone Encounter (Signed)
Immunization record updated.

## 2019-06-22 NOTE — Progress Notes (Signed)
Subjective:    Patient ID: John Harper, male    DOB: 12/16/1960, 59 y.o.   MRN: [id number]  DOS:  06/22/2019 Type of visit - description: CPX  Since the last office visit he is doing okay. The report chronic, left-sided, back pain.  No radiation, no lower extremity paresthesias  Review of Systems  Other than above, a 14 point review of systems is negative     Past Medical History:  Diagnosis Date  . Anal fissure   . BPH (benign prostatic hyperplasia)    w/o LUTS  . Erectile dysfunction due to arterial insufficiency   . Hemorrhoids   . 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 Peyronie's disease   . Viral meningitis 2013   had spinal tap.    Past Surgical History:  Procedure Laterality Date  . COLONOSCOPY    . POLYPECTOMY     Family History  Problem Relation Age of Onset  . Pancreatic cancer Father   . Coronary artery disease Father 84       CABG x2  . Prostate cancer Father 63  . Kidney cancer Father 74       has one kidney  . Stroke Mother 64  . Neuropathy Mother   . Hypertension Brother   . Diabetes Other        aunts and uncles  . Colon cancer Neg Hx   . Stomach cancer Neg Hx   . Esophageal cancer Neg Hx   . Rectal cancer Neg Hx   . Liver cancer Neg Hx      Allergies as of 06/22/2019   No Known Allergies     Medication List       Accurate as of June 22, 2019 11:59 PM. If you have any questions, ask your nurse or doctor.        aspirin 81 MG tablet Take 81 mg by mouth daily.   tadalafil 20 MG tablet Commonly known as: CIALIS Take 1 tablet (20 mg total) by mouth daily as needed for erectile dysfunction.   valACYclovir 500 MG tablet Commonly known as: VALTREX Take 1 tablet (500 mg total) by mouth daily.          Objective:   Physical Exam BP 120/78 (BP Location: Left Arm, Patient Position: Sitting, Cuff Size: Normal)   Pulse 74   Temp (!) 97.4 F (36.3 C) (Temporal)   Resp 16   Ht 5\' 7"  (1.702 m)   Wt 176 lb 2 oz (79.9 kg)    SpO2 96%   BMI 27.59 kg/m  General: Well developed, NAD, BMI noted Neck: No  thyromegaly  HEENT:  Normocephalic . Face symmetric, atraumatic Lungs:  CTA B Normal respiratory effort, no intercostal retractions, no accessory muscle use. Heart: RRR,  no murmur.  Abdomen:  Not distended, soft, non-tender. No rebound or rigidity.   Lower extremities: no pretibial edema bilaterally  Skin: Exposed areas without rash. Not pale. Not jaundice Neurologic:  alert & oriented X3.  Speech normal, gait appropriate for age and unassisted Strength symmetric and appropriate for age.  Psych: Cognition and judgment appear intact.  Cooperative with normal attention span and concentration.  Behavior appropriate. No anxious or depressed appearing.     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 Back pain: Chronic, mild but nagging.  He is still able to do all his exercise such as golf, running, weightlifting.  Treatment options discussed: PT referral, Ortho referral (? local injection at the SI left area).  He will think about it. Mollarett's  meningitis: Refill Valtrex RTC 1 year  This visit occurred during the SARS-CoV-2 public health emergency.  Safety protocols were in place, including screening questions prior to the visit, additional usage of staff PPE, and extensive cleaning of exam room while observing appropriate contact time as indicated for disinfecting solutions.

## 2019-06-22 NOTE — Progress Notes (Signed)
Pre visit review using our clinic review tool, if applicable. No additional management support is needed unless otherwise documented below in the visit note. 

## 2019-06-22 NOTE — Telephone Encounter (Signed)
Covid Vaccine     Pfizer   04/05/2019 first dosage  04/26/2019 second dosage

## 2019-06-22 NOTE — Patient Instructions (Addendum)
Please call with COVID-19 vaccine dates.    GO TO THE LAB : Get the blood work     [place], [place] Come back for  A physical exam in 1 year

## 2019-06-23 NOTE — Assessment & Plan Note (Signed)
Here for CPX BPH, Peyronie's disease, ED: No new symptoms, no concerns, refill medicines. Back pain: Chronic, mild but nagging.  He is still able to do all his exercise such as golf, running, weightlifting.  Treatment options discussed: PT referral, Ortho referral (? local injection at the SI left area).  He will think about it. Mollarett's  meningitis: Refill Valtrex RTC 1 year

## 2019-06-23 NOTE — Assessment & Plan Note (Signed)
-  Td 05-2016 - had pfizer covid shots -Recommend seasonal influenza vaccine - +FH CAD father , age 58s. asx EKG today: NSR. We talked about further prevention with atorvastatin, he is in favor of that.  We will wait for labs. --CCS: CCS: cscope #2---12-2013, Cscope again 03/2017 , next 3 years --Prostate cancer screening: +FH (father with prostate cancer DX ~ 59 y/o);  DRE PSA wnl 06/2018, check a PSA -- very active, exercises x 4/week  --Labs:  CMP, FLP, CBC, TSH, PSA --lung ca screening : Again discussed, pros/cons.  Will call if interested

## 2019-07-16 ENCOUNTER — Other Ambulatory Visit: Payer: Self-pay | Admitting: Internal Medicine

## 2019-09-15 ENCOUNTER — Telehealth: Payer: Self-pay | Admitting: Internal Medicine

## 2019-09-15 MED ORDER — ALPRAZOLAM 0.25 MG PO TABS: 0.2500 mg | ORAL_TABLET | Freq: Every day | ORAL | 0 refills | Status: DC | PRN

## 2019-09-15 NOTE — Telephone Encounter (Signed)
Spoke w/ Pt-informed of recommendations for Xanax. Pt verbalized understanding.

## 2019-09-15 NOTE — Telephone Encounter (Signed)
Okay, I sent a small amount of Xanax. Please make him aware that may cause drowsiness so he should not be driving the following hours after he takes it.

## 2019-09-15 NOTE — Telephone Encounter (Signed)
Pt requesting med for clautrophobia- he is having to fly on 09/24/19.

## 2019-09-15 NOTE — Telephone Encounter (Signed)
Call back # [phone]  Patient is flighting 09/24/19 he is claustrophobic and is wondering if there any prescription he could take to help.   Medication: valACYclovir (VALTREX) 500 MG tablet [[id number]]       Has the patient contacted their pharmacy?  (If no, request that the patient contact the pharmacy for the refill.) (If yes, when and what did the pharmacy advise?)     Preferred Pharmacy (with phone number or street name): [facility], [address], [place] [address]  Phone:  [phone] Fax:  [phone]      Agent: Please be advised that RX refills may take up to 3 business days. We ask that you follow-up with your pharmacy.

## 2020-03-29 ENCOUNTER — Encounter: Payer: Self-pay | Admitting: Internal Medicine

## 2020-03-29 DIAGNOSIS — Z1211 Encounter for screening for malignant neoplasm of colon: Secondary | ICD-10-CM

## 2020-04-05 ENCOUNTER — Encounter: Payer: Self-pay | Admitting: Internal Medicine

## 2020-06-24 ENCOUNTER — Other Ambulatory Visit: Payer: Self-pay

## 2020-06-24 ENCOUNTER — Ambulatory Visit (AMBULATORY_SURGERY_CENTER): Payer: BC Managed Care – PPO | Admitting: *Deleted

## 2020-06-24 VITALS — Ht 67.0 in | Wt 171.0 lb

## 2020-06-24 DIAGNOSIS — Z86010 Personal history of colonic polyps: Secondary | ICD-10-CM

## 2020-06-24 MED ORDER — SUTAB 1479-225-188 MG PO TABS: 1.0000 | ORAL_TABLET | ORAL | 0 refills | Status: DC

## 2020-06-24 NOTE — Progress Notes (Signed)
Patient's pre-visit was done today over the phone with the patient due to COVID-19 pandemic. Name,DOB and address verified. Insurance verified. Patient denies any allergies to Eggs and Soy. Patient denies any problems with anesthesia/sedation. Patient denies taking diet pills or blood thinners. Packet of Prep instructions mailed to patient including a copy of a consent form-and sutab coupon-pt is aware. Patient understands to call us back with any questions or concerns. Patient is aware of our care-partner policy and DIXVE-55 safety protocol. EMMI education assigned to the patient for the procedure, sent to [facility]. The patient is COVID-19 vaccinated, per patient. Patient request sutab pills.

## 2020-06-27 ENCOUNTER — Telehealth: Payer: Self-pay

## 2020-06-27 ENCOUNTER — Encounter: Payer: BC Managed Care – PPO | Admitting: Internal Medicine

## 2020-06-27 DIAGNOSIS — B003 Herpesviral meningitis: Secondary | ICD-10-CM

## 2020-06-27 MED ORDER — VALACYCLOVIR HCL 500 MG PO TABS: 500.0000 mg | ORAL_TABLET | Freq: Every day | ORAL | 0 refills | Status: DC

## 2020-06-27 NOTE — Addendum Note (Signed)
Addended byDamita Dunnings D on: 06/27/2020 11:52 AM   Modules accepted: Orders

## 2020-06-27 NOTE — Telephone Encounter (Signed)
Rx sent 

## 2020-06-27 NOTE — Telephone Encounter (Signed)
Pt called asking for Vatrex 500 to be refilled.90 days to [facility] at [facility].  Pending appointment next month.

## 2020-07-05 ENCOUNTER — Encounter: Payer: Self-pay | Admitting: Internal Medicine

## 2020-07-08 ENCOUNTER — Ambulatory Visit (AMBULATORY_SURGERY_CENTER): Payer: BC Managed Care – PPO | Admitting: Internal Medicine

## 2020-07-08 ENCOUNTER — Other Ambulatory Visit: Payer: Self-pay

## 2020-07-08 ENCOUNTER — Encounter: Payer: Self-pay | Admitting: Internal Medicine

## 2020-07-08 VITALS — BP 120/69 | HR 59 | Temp 96.6°F | Resp 9 | Ht 67.0 in | Wt 171.0 lb

## 2020-07-08 DIAGNOSIS — D125 Benign neoplasm of sigmoid colon: Secondary | ICD-10-CM

## 2020-07-08 DIAGNOSIS — Z1211 Encounter for screening for malignant neoplasm of colon: Secondary | ICD-10-CM | POA: Diagnosis not present

## 2020-07-08 DIAGNOSIS — D129 Benign neoplasm of anus and anal canal: Secondary | ICD-10-CM | POA: Diagnosis not present

## 2020-07-08 DIAGNOSIS — D123 Benign neoplasm of transverse colon: Secondary | ICD-10-CM

## 2020-07-08 DIAGNOSIS — Z86010 Personal history of colonic polyps: Secondary | ICD-10-CM | POA: Diagnosis not present

## 2020-07-08 DIAGNOSIS — K620 Anal polyp: Secondary | ICD-10-CM | POA: Diagnosis not present

## 2020-07-08 MED ORDER — SODIUM CHLORIDE 0.9 % IV SOLN: 500.0000 mL | Freq: Once | INTRAVENOUS | Status: DC

## 2020-07-08 NOTE — Progress Notes (Signed)
To PACU, VSS. Report to [email or web address].tb 

## 2020-07-08 NOTE — Progress Notes (Signed)
Medical history reviewed with no changes noted. VS assessed by C.W 

## 2020-07-08 NOTE — Op Note (Signed)
John Harper: John Harper Procedure Date: 07/08/2020 8:33 AM MRN: [id number] Endoscopist: Jerene Bears , MD Age: 60 Referring MD:  Date of Birth: 1960-01-12 Gender: Male Account #: [id number] Procedure:                Colonoscopy Indications:              High risk colon cancer surveillance: Personal                            history of adenomatous polyps (one 10 mm or greater                            in size at last exam), Last colonoscopy: March 2019 Medicines:                Monitored Anesthesia Care Procedure:                Pre-Anesthesia Assessment:                           - Prior to the procedure, a History and Physical                            was performed, and patient medications and                            allergies were reviewed. The patient's tolerance of                            previous anesthesia was also reviewed. The risks                            and benefits of the procedure and the sedation                            options and risks were discussed with the patient.                            All questions were answered, and informed consent                            was obtained. Prior Anticoagulants: The patient has                            taken no previous anticoagulant or antiplatelet                            agents. ASA Grade Assessment: II - A patient with                            mild systemic disease. After reviewing the risks                            and benefits, the patient was deemed in  satisfactory condition to undergo the procedure.                           After obtaining informed consent, the colonoscope                            was passed under direct vision. Throughout the                            procedure, the patient's blood pressure, pulse, and                            oxygen saturations were monitored continuously. The                            CF [id number]  #[id number] was introduced through the anus                            and advanced to the cecum, identified by                            appendiceal orifice and ileocecal valve. The                            colonoscopy was performed without difficulty. The                            patient tolerated the procedure well. The quality                            of the bowel preparation was good. The ileocecal                            valve, appendiceal orifice, and rectum were                            photographed. Scope In: 8:38:53 AM Scope Out: 8:55:29 AM Scope Withdrawal Time: 0 hours 12 minutes 53 seconds  Total Procedure Duration: 0 hours 16 minutes 36 seconds  Findings:                 Skin tags were found on perianal exam.                           A 5 mm polyp was found in the transverse colon. The                            polyp was sessile. The polyp was removed with a                            cold snare. Resection and retrieval were complete.                           A 2 mm polyp was found in the sigmoid colon.  The                            polyp was sessile. The polyp was removed with a                            cold snare. Resection and retrieval were complete.                           A few small-mouthed diverticula were found in the                            sigmoid colon.                           A 5 mm polyp was found in the anus. The polyp                            (versus skin tag) was semi-pedunculated. Biopsy was                            taken with a cold forceps for histology to exclude                            AIN.                           No additional abnormalities were found on                            retroflexion. Complications:            No immediate complications. Estimated Blood Loss:     Estimated blood loss was minimal. Impression:               - One 5 mm polyp in the transverse colon, removed                            with a cold  snare. Resected and retrieved.                           - One 2 mm polyp in the sigmoid colon, removed with                            a cold snare. Resected and retrieved.                           - Diverticulosis in the sigmoid colon.                           - One 5 mm polyp at the anus. Biopsied to exclude                            AIN. Recommendation:           - Patient has a contact number  available for                            emergencies. The signs and symptoms of potential                            delayed complications were discussed with the                            patient. Return to normal activities tomorrow.                            Written discharge instructions were provided to the                            patient.                           - Resume previous diet.                           - Continue present medications.                           - Await pathology results.                           - Repeat colonoscopy is recommended for                            surveillance. The colonoscopy date will be                            determined after pathology results from today's                            exam become available for review. Jerene Bears, MD 07/08/2020 9:00:15 AM This report has been signed electronically.

## 2020-07-08 NOTE — Progress Notes (Signed)
Called to room to assist during endoscopic procedure.  Patient ID and intended procedure confirmed with present staff. Received instructions for my participation in the procedure from the performing physician.  

## 2020-07-08 NOTE — Progress Notes (Signed)
No problems noted in the recovery room. maw  PT HIPPA.  AVS sealed in envelope

## 2020-07-08 NOTE — Patient Instructions (Addendum)
Handouts were given to your care partner on polyps and diverticulosis. You may resume your current medications today. Await biopsy results.  May take 1-3 weeks to receive pathology results. Please call if any questions or concerns.      YOU HAD AN ENDOSCOPIC PROCEDURE TODAY AT [facility]:   Refer to the procedure report that was given to you for any specific questions about what was found during the examination.  If the procedure report does not answer your questions, please call your gastroenterologist to clarify.  If you requested that your care partner not be given the details of your procedure findings, then the procedure report has been included in a sealed envelope for you to review at your convenience later.  YOU SHOULD EXPECT: Some feelings of bloating in the abdomen. Passage of more gas than usual.  Walking can help get rid of the air that was put into your GI tract during the procedure and reduce the bloating. If you had a lower endoscopy (such as a colonoscopy or flexible sigmoidoscopy) you may notice spotting of blood in your stool or on the toilet paper. If you underwent a bowel prep for your procedure, you may not have a normal bowel movement for a few days.  Please Note:  You might notice some irritation and congestion in your nose or some drainage.  This is from the oxygen used during your procedure.  There is no need for concern and it should clear up in a day or so.  SYMPTOMS TO REPORT IMMEDIATELY:  Following lower endoscopy (colonoscopy or flexible sigmoidoscopy):  Excessive amounts of blood in the stool  Significant tenderness or worsening of abdominal pains  Swelling of the abdomen that is new, acute  Fever of 100F or higher   For urgent or emergent issues, a gastroenterologist can be reached at any hour by calling [phone]. Do not use MyChart messaging for urgent concerns.    DIET:  We do recommend a small meal at first, but then you may  proceed to your regular diet.  Drink plenty of fluids but you should avoid alcoholic beverages for 24 hours.  ACTIVITY:  You should plan to take it easy for the rest of today and you should NOT DRIVE or use heavy machinery until tomorrow (because of the sedation medicines used during the test).    FOLLOW UP: Our staff will call the number listed on your records 48-72 hours following your procedure to check on you and address any questions or concerns that you may have regarding the information given to you following your procedure. If we do not reach you, we will leave a message.  We will attempt to reach you two times.  During this call, we will ask if you have developed any symptoms of COVID 19. If you develop any symptoms (ie: fever, flu-like symptoms, shortness of breath, cough etc.) before then, please call [phone].  If you test positive for Covid 19 in the 2 weeks post procedure, please call and report this information to [place].    If any biopsies were taken you will be contacted by phone or by letter within the next 1-3 weeks.  Please call us at [phone] if you have not heard about the biopsies in 3 weeks.    SIGNATURES/CONFIDENTIALITY: You and/or your care partner have signed paperwork which will be entered into your electronic medical record.  These signatures attest to the fact that that the information above on your After Visit  Summary has been reviewed and is understood.  Full responsibility of the confidentiality of this discharge information lies with you and/or your care-partner.  

## 2020-07-13 ENCOUNTER — Telehealth: Payer: Self-pay

## 2020-07-13 NOTE — Telephone Encounter (Signed)
  Follow up Call-  Call back number 07/08/2020  Post procedure Call Back phone  # [phone]  Permission to leave phone message Yes  Some recent data might be hidden     Patient questions:  Do you have a fever, pain , or abdominal swelling? No. Pain Score  0 *  Have you tolerated food without any problems? Yes.    Have you been able to return to your normal activities? Yes.    Do you have any questions about your discharge instructions: Diet   No. Medications  No. Follow up visit  No.  Do you have questions or concerns about your Care? No.  Actions: * If pain score is 4 or above: No action needed, pain <4. Have you developed a fever since your procedure? no  2.   Have you had an respiratory symptoms (SOB or cough) since your procedure? no  3.   Have you tested positive for COVID 19 since your procedure no  4.   Have you had any family members/close contacts diagnosed with the COVID 19 since your procedure?  no   If yes to any of these questions please route to Joylene John, RN and Joella Prince, RN

## 2020-07-20 ENCOUNTER — Encounter: Payer: Self-pay | Admitting: Internal Medicine

## 2020-07-27 ENCOUNTER — Ambulatory Visit: Payer: BC Managed Care – PPO | Attending: Internal Medicine

## 2020-07-27 ENCOUNTER — Other Ambulatory Visit: Payer: Self-pay

## 2020-07-27 ENCOUNTER — Ambulatory Visit (INDEPENDENT_AMBULATORY_CARE_PROVIDER_SITE_OTHER): Payer: BC Managed Care – PPO | Admitting: Internal Medicine

## 2020-07-27 ENCOUNTER — Encounter: Payer: Self-pay | Admitting: Internal Medicine

## 2020-07-27 VITALS — BP 162/84 | HR 64 | Temp 98.0°F | Resp 16 | Ht 67.0 in | Wt 170.5 lb

## 2020-07-27 DIAGNOSIS — Z23 Encounter for immunization: Secondary | ICD-10-CM

## 2020-07-27 DIAGNOSIS — Z0000 Encounter for general adult medical examination without abnormal findings: Secondary | ICD-10-CM

## 2020-07-27 DIAGNOSIS — B003 Herpesviral meningitis: Secondary | ICD-10-CM | POA: Diagnosis not present

## 2020-07-27 LAB — LIPID PANEL
Cholesterol: 172 mg/dL (ref 0–200)
HDL: 67.5 mg/dL (ref >39.00)
LDL Cholesterol: 84 mg/dL (ref 0–99)
NonHDL: 104.65
Total CHOL/HDL Ratio: 3
Triglycerides: 102 mg/dL (ref 0.0–149.0)
VLDL: 20.4 mg/dL (ref 0.0–40.0)

## 2020-07-27 LAB — COMPREHENSIVE METABOLIC PANEL
ALT: 14 U/L (ref 0–53)
AST: 16 U/L (ref 0–37)
Albumin: 4.2 g/dL (ref 3.5–5.2)
Alkaline Phosphatase: 57 U/L (ref 39–117)
BUN: 14 mg/dL (ref 6–23)
CO2: 29 mEq/L (ref 19–32)
Calcium: 9.2 mg/dL (ref 8.4–10.5)
Chloride: 102 mEq/L (ref 96–112)
Creatinine, Ser: 0.92 mg/dL (ref 0.40–1.50)
GFR: 90.42 mL/min (ref >60.00)
Glucose, Bld: 95 mg/dL (ref 70–99)
Potassium: 4.4 mEq/L (ref 3.5–5.1)
Sodium: 137 mEq/L (ref 135–145)
Total Bilirubin: 0.9 mg/dL (ref 0.2–1.2)
Total Protein: 6.4 g/dL (ref 6.0–8.3)

## 2020-07-27 LAB — CBC WITH DIFFERENTIAL/PLATELET
Basophils Absolute: 0.1 10*3/uL (ref 0.0–0.1)
Basophils Relative: 0.9 % (ref 0.0–3.0)
Eosinophils Absolute: 0.4 10*3/uL (ref 0.0–0.7)
Eosinophils Relative: 7.1 % — ABNORMAL HIGH (ref 0.0–5.0)
HCT: 47.8 % (ref 39.0–52.0)
Hemoglobin: 15.7 g/dL (ref 13.0–17.0)
Lymphocytes Relative: 22.7 % (ref 12.0–46.0)
Lymphs Abs: 1.3 10*3/uL (ref 0.7–4.0)
MCHC: 32.7 g/dL (ref 30.0–36.0)
MCV: 99.8 fl (ref 78.0–100.0)
Monocytes Absolute: 0.5 10*3/uL (ref 0.1–1.0)
Monocytes Relative: 9.3 % (ref 3.0–12.0)
Neutro Abs: 3.4 10*3/uL (ref 1.4–7.7)
Neutrophils Relative %: 60 % (ref 43.0–77.0)
Platelets: 190 10*3/uL (ref 150.0–400.0)
RBC: 4.79 Mil/uL (ref 4.22–5.81)
RDW: 13.8 % (ref 11.5–15.5)
WBC: 5.6 10*3/uL (ref 4.0–10.5)

## 2020-07-27 LAB — PSA: PSA: 1.9 ng/mL (ref 0.10–4.00)

## 2020-07-27 MED ORDER — VALACYCLOVIR HCL 500 MG PO TABS: 500.0000 mg | ORAL_TABLET | Freq: Every day | ORAL | 3 refills | Status: DC

## 2020-07-27 NOTE — Progress Notes (Signed)
Subjective:    Patient ID: John Harper, male    DOB: 1960-10-15, 60 y.o.   MRN: [id number]  DOS:  07/27/2020 Type of visit - description: CPX   Here for CPX, we also discussed other issues BP noted to be slightly elevated, no ambulatory BPs Difficulty sleeping: Wakes up late at night and unable to go back to sleep d/t " start thinking about things". Continues with back pain, at baseline 4 to 5 years ago developed lump at the left second toe.  Asymptomatic, has not grown in the last 2 to 3 years.   Review of Systems  Other than above, a 14 point review of systems is negative     Past Medical History:  Diagnosis Date   Anal fissure    BPH (benign prostatic hyperplasia)    w/o LUTS   Erectile dysfunction due to arterial insufficiency    Hemorrhoids    Herpes simplex meningitis 12/22/2014   Peyronie's disease    Viral meningitis 2013   had spinal tap.    Past Surgical History:  Procedure Laterality Date   COLONOSCOPY  03/2017   [place]   POLYPECTOMY     Social History   Socioeconomic History   Marital status: Divorced    Spouse name: Not on file   Number of children: 2   Years of education: Not on file   Highest education level: Not on file  Occupational History   Occupation: Press photographer, has his own company  Tobacco Use   Smoking status: Former    Types: Cigarettes    Quit date: 10/27/2008    Years since quitting: 11.7   Smokeless tobacco: Never   Tobacco comments:    quit 2010 ( 1 ppd x 32 years from 1978 to 2010))  Vaping Use   Vaping Use: Never used  Substance and Sexual Activity   Alcohol use: Yes    Alcohol/week: 10.0 standard drinks    Types: 10 Cans of beer per week   Drug use: No   Sexual activity: Not on file  Other Topics Concern   Not on file  Social History Narrative   Lives by himself.    Twins, boy-girl, live part time w/ pt    Social Determinants of Health   Financial Resource Strain: Not on file  Food Insecurity: Not on file   Transportation Needs: Not on file  Physical Activity: Not on file  Stress: Not on file  Social Connections: Not on file  Intimate Partner Violence: Not on file     Allergies as of 07/27/2020   No Known Allergies      Medication List        Accurate as of July 27, 2020 11:59 PM. If you have any questions, ask your nurse or doctor.          aspirin 81 MG tablet Take 81 mg by mouth daily.   tadalafil 20 MG tablet Commonly known as: CIALIS Take 1 tablet (20 mg total) by mouth daily as needed for erectile dysfunction.   valACYclovir 500 MG tablet Commonly known as: VALTREX Take 1 tablet (500 mg total) by mouth daily.           Objective:   Physical Exam BP (!) 162/84 (BP Location: Left Arm, Patient Position: Sitting, Cuff Size: Small)   Pulse 64   Temp 98 F (36.7 C) (Oral)   Resp 16   Ht 5\' 7"  (1.702 m)   Wt 170 lb 8 oz (77.3 kg)  SpO2 98%   BMI 26.70 kg/m  General: Well developed, NAD, BMI noted Neck: No  thyromegaly  HEENT:  Normocephalic . Face symmetric, atraumatic Lungs:  CTA B Normal respiratory effort, no intercostal retractions, no accessory muscle use. Heart: RRR,  no murmur.  Abdomen:  Not distended, soft, non-tender. No rebound or rigidity.   Left second toe: Has a 5 mm, translucent, superficial, lump.  Not tender DRE: Normal sphincter tone, no stools, prostate normal Skin: Exposed areas without rash. Not pale. Not jaundice Neurologic:  alert & oriented X3.  Speech normal, gait appropriate for age and unassisted Strength symmetric and appropriate for age.  Psych: Cognition and judgment appear intact.  Cooperative with normal attention span and concentration.  Behavior appropriate. No anxious or depressed appearing.     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 Insomnia: As described above,sleep tips provided, reassess in 3 months  Will rec shingrix #1 on RTC RTC 3 months.       This visit occurred during the SARS-CoV-2 public health emergency.  Safety protocols were in place, including screening questions prior to the visit, additional usage of staff PPE, and extensive cleaning of exam room while observing appropriate contact time as indicated for disinfecting solutions.

## 2020-07-27 NOTE — Patient Instructions (Addendum)
Check the  blood pressure weekly BP GOAL is between 110/65 and  135/85. If it is consistently higher or lower, let me know  HEALTHY SLEEP Sleep hygiene: Basic rules for a good night's sleep  Sleep only as much as you need to feel rested and then get out of bed  Keep a regular sleep schedule  Avoid forcing sleep  Exercise regularly for at least 20 minutes, preferably 4 to 5 hours before bedtime  Avoid caffeinated beverages after lunch  Avoid alcohol near bedtime: no "night cap"  Avoid smoking, especially in the evening  Do not go to bed hungry  Adjust bedroom environment  Avoid prolonged use of light-emitting screens before bedtime   Deal with your worries before bedtime    Recommend to monitor home, office visit in 3 months patient is undecided.  GO TO THE LAB : Get the blood work     [place], [place] back for   a checkup in 3 months    "Living will", "[place] of attorney": Advanced care planning  (If you already have a living will or healthcare power of attorney, please bring the copy to be scanned in your chart.)  Advance care planning is a process that supports adults in  understanding and sharing their preferences regarding future medical care.   The patient's preferences are recorded in documents called Advance Directives.    Advanced directives are completed (and can be modified at any time) while the patient is in full mental capacity.   The documentation should be available at all times to the patient, the family and the healthcare providers.  Bring in a copy to be scanned in your chart is an excellent idea and is recommended   This legal documents direct treatment decision making and/or appoint a surrogate to make the decision if the patient is not capable to do so.    Advance directives can be documented in many types of formats,  documents have names such as:  Lliving will  Durable power of attorney for  healthcare (healthcare proxy or healthcare power of attorney)  Combined directives  Physician orders for life-sustaining treatment    More information at:  [email or web address]

## 2020-07-27 NOTE — Progress Notes (Signed)
   Covid-19 Vaccination Clinic  Name:  John Harper    MRN: [id number] DOB: 02/14/1960  07/27/2020  John Harper was observed post Covid-19 immunization for 15 minutes without incident. He was provided with Vaccine Information Sheet and instruction to access the V-Safe system.   John Harper was instructed to call 911 with any severe reactions post vaccine: Difficulty breathing  Swelling of face and throat  A fast heartbeat  A bad rash all over body  Dizziness and weakness   Immunizations Administered     Name Date Dose VIS Date Route   PFIZER Comrnaty(Gray TOP) Covid-19 Vaccine 07/27/2020  9:07 AM 0.3 mL 12/17/2019 Intramuscular   Manufacturer: [place]   Lot: [id number]   [place]: [phone]

## 2020-07-28 ENCOUNTER — Encounter: Payer: Self-pay | Admitting: Internal Medicine

## 2020-07-28 MED ORDER — ATORVASTATIN CALCIUM 10 MG PO TABS: 10.0000 mg | ORAL_TABLET | Freq: Every day | ORAL | 4 refills | Status: DC

## 2020-07-28 NOTE — Addendum Note (Signed)
Addended byDamita Dunnings D on: 07/28/2020 03:00 PM   Modules accepted: Orders

## 2020-07-28 NOTE — Assessment & Plan Note (Signed)
-  Td 05-2016 - covid vax x 3, rec #4 - shingrix: will do in  3 months  -Rec seasonal influenza vaccine - +FH CAD father, age 56s.  Patient is asymptomatic, CV RF 10 years 10%.  Will rec statins with results. --CCS: CCS: cscope #2---12-2013, Cscope again 03/2017 , 07/2020 --Prostate cancer screening: +FH, father . DRE normal, check PSA -- very active, exercises x 4/week  --Labs:  CMP, FLP, CBC, PSA. --lung ca screening : Again discussed, prefers to wait. -POA discussed

## 2020-07-28 NOTE — Assessment & Plan Note (Signed)
Here for CPX Elevated BP: rec to monitor regularly, reassess in 3 months  BPH: Denies any symptoms today. 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 Insomnia: As described above,sleep tips provided, reassess in 3 months  Will rec shingrix #1 on RTC RTC 3 months.

## 2020-08-01 ENCOUNTER — Other Ambulatory Visit (HOSPITAL_BASED_OUTPATIENT_CLINIC_OR_DEPARTMENT_OTHER): Payer: Self-pay

## 2020-08-01 MED ORDER — COVID-19 MRNA VAC-TRIS(PFIZER) 30 MCG/0.3ML IM SUSP
INTRAMUSCULAR | 0 refills | Status: DC
  Filled 2020-08-01: qty 0.3, 1d supply, fill #0

## 2020-10-13 ENCOUNTER — Other Ambulatory Visit: Payer: Self-pay | Admitting: Internal Medicine

## 2020-10-14 ENCOUNTER — Telehealth: Payer: Self-pay | Admitting: Internal Medicine

## 2020-10-14 MED ORDER — PRAVASTATIN SODIUM 20 MG PO TABS
20.0000 mg | ORAL_TABLET | Freq: Every day | ORAL | 0 refills | Status: DC
Start: 2020-10-14 — End: 2020-11-29

## 2020-10-14 NOTE — Telephone Encounter (Signed)
Spoke w/ Pt- informed of recommendations. We moved appt from 10/19 to 11/17- will recheck labs at that time. Rx sent to [facility].

## 2020-10-14 NOTE — Telephone Encounter (Signed)
Please advise 

## 2020-10-14 NOTE — Telephone Encounter (Signed)
Patient states that his atorvastatin (LIPITOR) 10 MG tablet medication has been giving him headaches. He stopped taking it and headaches stopped, so he started retaking and the headaches came back. He would like to know if something else can be given instead. Please advice.

## 2020-10-14 NOTE — Telephone Encounter (Signed)
Switch to Pravachol 20 mg 1 p.o. nightly, 58-month supplies. FLP, AST, ALT in 6 weeks

## 2020-10-26 ENCOUNTER — Ambulatory Visit: Payer: BC Managed Care – PPO | Admitting: Internal Medicine

## 2020-11-24 ENCOUNTER — Ambulatory Visit: Payer: BC Managed Care – PPO | Admitting: Internal Medicine

## 2020-11-24 ENCOUNTER — Encounter: Payer: Self-pay | Admitting: Internal Medicine

## 2020-11-24 ENCOUNTER — Other Ambulatory Visit: Payer: Self-pay

## 2020-11-24 VITALS — BP 142/82 | HR 64 | Temp 98.1°F | Resp 16 | Ht 67.0 in | Wt 176.1 lb

## 2020-11-24 DIAGNOSIS — R030 Elevated blood-pressure reading, without diagnosis of hypertension: Secondary | ICD-10-CM | POA: Diagnosis not present

## 2020-11-24 DIAGNOSIS — E785 Hyperlipidemia, unspecified: Secondary | ICD-10-CM | POA: Diagnosis not present

## 2020-11-24 LAB — LIPID PANEL
Cholesterol: 167 mg/dL (ref 0–200)
HDL: 72.4 mg/dL (ref >39.00)
LDL Cholesterol: 79 mg/dL (ref 0–99)
NonHDL: 94.55
Total CHOL/HDL Ratio: 2
Triglycerides: 80 mg/dL (ref 0.0–149.0)
VLDL: 16 mg/dL (ref 0.0–40.0)

## 2020-11-24 LAB — ALT: ALT: 17 U/L (ref 0–53)

## 2020-11-24 LAB — AST: AST: 16 U/L (ref 0–37)

## 2020-11-24 MED ORDER — SHINGRIX 50 MCG/0.5ML IM SUSR: 0.5000 mL | Freq: Once | INTRAMUSCULAR | 1 refills | Status: AC

## 2020-11-24 NOTE — Progress Notes (Signed)
Subjective:    Patient ID: John Harper, male    DOB: 10/23/1960, 60 y.o.   MRN: [id number]  DOS:  11/24/2020 Type of visit - description: f/u  Since the last office visit is taking cholesterol medication. We also talk about his blood pressure. Doing great with lifestyle, exercising regularly, eating healthy.     BP Readings from Last 3 Encounters:  11/24/20 (!) 142/82  07/27/20 (!) 162/84  07/08/20 120/69     Review of Systems See above   Past Medical History:  Diagnosis Date   Anal fissure    BPH (benign prostatic hyperplasia)    w/o LUTS   Erectile dysfunction due to arterial insufficiency    Hemorrhoids    Herpes simplex meningitis 12/22/2014   Peyronie's disease    Viral meningitis 2013   had spinal tap.    Past Surgical History:  Procedure Laterality Date   COLONOSCOPY  03/2017   Pyrtle   POLYPECTOMY      Allergies as of 11/24/2020   No Known Allergies      Medication List        Accurate as of November 24, 2020 11:59 PM. If you have any questions, ask your nurse or doctor.          STOP taking these medications    Pfizer-BioNT COVID-19 Vac-TriS Susp injection Generic drug: COVID-19 mRNA Vac-TriS Therapist, music) Stopped by: Kathlene November, MD       TAKE these medications    aspirin 81 MG tablet Take 81 mg by mouth daily.   pravastatin 20 MG tablet Commonly known as: PRAVACHOL Take 1 tablet (20 mg total) by mouth at bedtime.   Shingrix injection Generic drug: Zoster Vaccine Adjuvanted Inject 0.5 mLs into the muscle once for 1 dose. Started by: Kathlene November, MD   tadalafil 20 MG tablet Commonly known as: CIALIS TAKE ONE TABLET BY MOUTH DAILY AS NEEDED FOR FOR ERECTILE DYSFUNCTION   valACYclovir 500 MG tablet Commonly known as: VALTREX Take 1 tablet (500 mg total) by mouth daily.           Objective:   Physical Exam BP (!) 142/82 (BP Location: Left Arm, Patient Position: Sitting, Cuff Size: Small)   Pulse 64   Temp 98.1 F (36.7  C) (Oral)   Resp 16   Ht 5\' 7"  (1.702 m)   Wt 176 lb 2 oz (79.9 kg)   SpO2 98%   BMI 27.59 kg/m  General:   Well developed, NAD, BMI noted. HEENT:  Normocephalic . Face symmetric, atraumatic Lower extremities: no pretibial edema bilaterally  Skin: Not pale. Not jaundice Neurologic:  alert & oriented X3.  Speech normal, gait appropriate for age and unassisted Psych--  Cognition and judgment appear intact.  Cooperative with normal attention span and concentration.  Behavior appropriate. No anxious or depressed appearing.      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 Was switch to Pravachol and the headaches ended. He is doing great with lifestyle, check FLP, AST, ALT, refill Pravachol with  results. Elevated BP: Patient's home sphygmomanometer used today here while at the office.  Reading was: 171/101.   Manual recheck by my nurse: 152/92 (L arm), 142/84 (L arm) He is frustrated b/c BP remains elevated despite healthy lifestyle.  D/w pt  the role of genetics on BPs. Also, his sphygmomanometer seems to be inaccurate. The patient felt better after that. I offer medication, however at the end we agreed to continue checking, see AVS, he will let me know in 4 weeks how he is doing.  Again, consider meds. Preventive care: Recommend Shingrix, Rx printed.  Also COVID booster. RTC 07-2021 CPX   This visit occurred during the SARS-CoV-2 public health emergency.  Safety protocols were  in place, including screening questions prior to the visit, additional usage of staff PPE, and extensive cleaning of exam room while observing appropriate contact time as indicated for disinfecting solutions.

## 2020-11-24 NOTE — Patient Instructions (Addendum)
Check the  blood pressure 2 or 3 times a week, monthly weekly  Be sure your blood pressure is between 110/65 and  145/85.  if it is consistently higher or lower, let me know  To find a good quality blood pressure cuff:    [email or web address]   HOW TO TAKE YOUR BLOOD PRESSURE:   Rest 5 minutes before taking your blood pressure.   Don't smoke or drink caffeinated beverages for at least 30 minutes before.   Take your blood pressure before (not after) you eat.   Sit comfortably with your back supported and both feet on the floor (don't cross your legs).   Elevate your arm to heart level on a table or a desk.   Use the proper sized cuff. It should fit smoothly and snugly around your bare upper arm. There should be enough room to slip a fingertip under the cuff. The bottom edge of the cuff should be 1 inch above the crease of the elbow.   Ideally, take 3 measurements at one sitting and record the average.   Send me a message with your blood pressure readings in 4 weeks.  Consider medication.  GO TO THE LAB : Get the blood work     [place], [place] Come back for a physical exam by 07-2021

## 2020-11-25 NOTE — Assessment & Plan Note (Signed)
Hyperlipidemia: Based on last FLP, he was prescribed Lipitor, he noted that while on Lipitor he had a headache after taking tadalafil. Was switch to Pravachol and the headaches ended. He is doing great with lifestyle, check FLP, AST, ALT, refill Pravachol with results. Elevated BP: Patient's home sphygmomanometer used today here while at the office.  Reading was: 171/101.   Manual recheck by my nurse: 152/92 (L arm), 142/84 (L arm) He is frustrated b/c BP remains elevated despite healthy lifestyle.  D/w pt  the role of genetics on BPs. Also, his sphygmomanometer seems to be inaccurate. The patient felt better after that. I offer medication, however at the end we agreed to continue checking, see AVS, he will let me know in 4 weeks how he is doing.  Again, consider meds. Preventive care: Recommend Shingrix, Rx printed.  Also COVID booster. RTC 07-2021 CPX

## 2020-11-29 MED ORDER — PRAVASTATIN SODIUM 20 MG PO TABS: 20.0000 mg | ORAL_TABLET | Freq: Every day | ORAL | 3 refills | Status: DC

## 2020-11-29 NOTE — Addendum Note (Signed)
Addended byDamita Dunnings D on: 11/29/2020 08:18 AM   Modules accepted: Orders

## 2021-01-19 ENCOUNTER — Ambulatory Visit: Payer: BC Managed Care – PPO | Attending: Internal Medicine

## 2021-01-19 DIAGNOSIS — Z23 Encounter for immunization: Secondary | ICD-10-CM

## 2021-01-19 NOTE — Progress Notes (Signed)
° °  Covid-19 Vaccination Clinic  Name:  John Harper    MRN: [id number] DOB: 08-20-1960  01/19/2021  [name] was observed post Covid-19 immunization for 15 minutes without incident. He was provided with Vaccine Information Sheet and instruction to access the V-Safe system.   Mr. Odor was instructed to call 911 with any severe reactions post vaccine: Difficulty breathing  Swelling of face and throat  A fast heartbeat  A bad rash all over body  Dizziness and weakness   Immunizations Administered     Name Date Dose VIS Date Route   Pfizer Covid-19 Vaccine Bivalent Booster 01/19/2021 11:56 AM 0.3 mL 09/07/2020 Intramuscular   Manufacturer: [place]   Lot: [id number]   [place]: [phone]

## 2021-01-20 ENCOUNTER — Other Ambulatory Visit (HOSPITAL_BASED_OUTPATIENT_CLINIC_OR_DEPARTMENT_OTHER): Payer: Self-pay

## 2021-01-20 MED ORDER — PFIZER COVID-19 VAC BIVALENT 30 MCG/0.3ML IM SUSP
INTRAMUSCULAR | 0 refills | Status: DC
  Filled 2021-01-20: qty 0.3, 1d supply, fill #0

## 2021-01-27 ENCOUNTER — Encounter: Payer: Self-pay | Admitting: Internal Medicine

## 2021-01-27 ENCOUNTER — Telehealth: Payer: Self-pay | Admitting: Internal Medicine

## 2021-01-27 NOTE — Telephone Encounter (Signed)
Inbound call from patient requesting a call from Dr. Hilarie Fredrickson please.  States he has questions in regards to a procedure he had done.  Did not specify further.

## 2021-01-30 NOTE — Telephone Encounter (Signed)
Spoke with pt but he would not elaborate about what he wanted to discuss. States he would prefer Dr. Hilarie Fredrickson call him to discuss. Dr. Hilarie Fredrickson notified.

## 2021-01-30 NOTE — Telephone Encounter (Signed)
Spoke to patient by phone. He had questions regarding his colonoscopy performed on 07/08/2020 for polyp surveillance.  Additional polyps were removed at that time. He has been discussing with his insurance company as well as [employer] why his procedure was not covered as screening/surveillance but rather diagnostic. I explained to him that this procedure was coded correctly, but this is likely an insurance policy issue where screening/surveillance procedures were polyps removed or changed to diagnostic [employer], could you please check into this with our billing and let him know what you find. Ensure that it is coded correctly, as I expected his Time provided for questions and answers, he thanked me for the call.  I ensured him that he is doing the correct thing having surveillance colonoscopies given his history of adenomatous colon polyps. Thanks [employer]

## 2021-01-31 NOTE — Telephone Encounter (Signed)
John Harper, I looked at the coloscopy report and it is coded correct. The pt may not be using the correct verbiage with he calls his insurance company. He needs to ask about the surveillance  benefits not screening. Most insurance co do not pay any different for the surveillance than a diagnostic . I hope this helps. [name]  I discussed with the patient and provided him by email requested previous procedures and their billing codes.  He will call back for any additional questions or concerns.   Patient is reassured that the procedure was coded as a surveillance screening.

## 2021-01-31 NOTE — Telephone Encounter (Signed)
Questions sent to coding for inquiry

## 2021-06-20 ENCOUNTER — Ambulatory Visit (HOSPITAL_BASED_OUTPATIENT_CLINIC_OR_DEPARTMENT_OTHER)
Admission: RE | Admit: 2021-06-20 | Discharge: 2021-06-20 | Disposition: A | Discharge status: Home / Self Care | Payer: BC Managed Care – PPO | Source: Ambulatory Visit | Attending: Medical | Admitting: Medical

## 2021-06-20 ENCOUNTER — Ambulatory Visit: Payer: BC Managed Care – PPO | Admitting: Medical

## 2021-06-20 VITALS — BP 137/69 | HR 58 | Temp 98.0°F | Resp 16 | Ht 67.0 in | Wt 179.8 lb

## 2021-06-20 DIAGNOSIS — M7989 Other specified soft tissue disorders: Secondary | ICD-10-CM | POA: Diagnosis not present

## 2021-06-20 DIAGNOSIS — S9001XA Contusion of right ankle, initial encounter: Secondary | ICD-10-CM | POA: Diagnosis not present

## 2021-06-20 DIAGNOSIS — M25571 Pain in right ankle and joints of right foot: Secondary | ICD-10-CM

## 2021-06-20 DIAGNOSIS — M19071 Primary osteoarthritis, right ankle and foot: Secondary | ICD-10-CM | POA: Diagnosis not present

## 2021-06-20 NOTE — Progress Notes (Signed)
   Subjective:    Patient ID: John Harper, male    DOB: November 22, 1960, 61 y.o.   MRN: [id number]  HPI  Pt in with rt ankle pain. He stepped in a whole and twisted his ankle on saturday. He states able to walk but doing nothing very aggressive. However he did play golf and tolerated walking.however was driving cart and limited walking.  Pt also jogs on treadmill 2 days a week 4-5 miles a day.  Level of pain now just on twisting /pivoting or walking for long periods.      Review of Systems     Objective:   Physical Exam  General- no acute distress. Rt ankle- moderate swelling medial and lateral aspect. Talofibular ligament not tender to palpation but swollen.  Rt foot- no portion of foot tender but faint bruise that has followed gravity from ankle.       Assessment & Plan:   Patient Instructions  For rt ankle pain will get xray today.   Rest, ice, compression and elevation.  Can use low dose ibuprofen 200 mg or 1 tab of alleve. Bp mid elevated.  Follow up 10 days in office or sooner if needed. If by 7-8 days no pain have option to cancel if no pain  If pain lingers can consider sport med referral as well.  Discussed how to get back to regular exercise/incremental   Mackie Pai, PA-C   Gave option of different location for xray since concern for possible higher cost here. Discussed with pt and educated on possibility. He preferred to get done here.  Time spent with patient today was  30 minutes which consisted of chart review, discussing diagnosis, work up treatment and documentation.

## 2021-06-20 NOTE — Patient Instructions (Addendum)
For rt ankle pain will get xray today.   Rest, ice, compression and elevation.  Can use low dose ibuprofen 200 mg or 1 tab of alleve. Bp mid elevated.  Follow up 10 days in office or sooner if needed. If by 7-8 days no pain have option to cancel if no pain  If pain lingers can consider sport med referral as well.  Discussed how to get back to regular exercise/incremental

## 2021-07-24 ENCOUNTER — Ambulatory Visit (INDEPENDENT_AMBULATORY_CARE_PROVIDER_SITE_OTHER): Payer: BC Managed Care – PPO | Admitting: Internal Medicine

## 2021-07-24 ENCOUNTER — Encounter: Payer: Self-pay | Admitting: Internal Medicine

## 2021-07-24 VITALS — BP 126/76 | HR 78 | Temp 98.5°F | Resp 16 | Ht 67.0 in | Wt 177.2 lb

## 2021-07-24 DIAGNOSIS — E785 Hyperlipidemia, unspecified: Secondary | ICD-10-CM | POA: Diagnosis not present

## 2021-07-24 DIAGNOSIS — Z0000 Encounter for general adult medical examination without abnormal findings: Secondary | ICD-10-CM

## 2021-07-24 LAB — CBC WITH DIFFERENTIAL/PLATELET
Basophils Absolute: 0 10*3/uL (ref 0.0–0.1)
Basophils Relative: 0.7 % (ref 0.0–3.0)
Eosinophils Absolute: 0.3 10*3/uL (ref 0.0–0.7)
Eosinophils Relative: 5.6 % — ABNORMAL HIGH (ref 0.0–5.0)
HCT: 44.2 % (ref 39.0–52.0)
Hemoglobin: 14.5 g/dL (ref 13.0–17.0)
Lymphocytes Relative: 16 % (ref 12.0–46.0)
Lymphs Abs: 0.9 10*3/uL (ref 0.7–4.0)
MCHC: 32.7 g/dL (ref 30.0–36.0)
MCV: 98 fl (ref 78.0–100.0)
Monocytes Absolute: 0.4 10*3/uL (ref 0.1–1.0)
Monocytes Relative: 7.3 % (ref 3.0–12.0)
Neutro Abs: 4.1 10*3/uL (ref 1.4–7.7)
Neutrophils Relative %: 70.4 % (ref 43.0–77.0)
Platelets: 193 10*3/uL (ref 150.0–400.0)
RBC: 4.51 Mil/uL (ref 4.22–5.81)
RDW: 13.6 % (ref 11.5–15.5)
WBC: 5.9 10*3/uL (ref 4.0–10.5)

## 2021-07-24 LAB — LIPID PANEL
Cholesterol: 165 mg/dL (ref 0–200)
HDL: 62.7 mg/dL (ref >39.00)
LDL Cholesterol: 72 mg/dL (ref 0–99)
NonHDL: 102.13
Total CHOL/HDL Ratio: 3
Triglycerides: 149 mg/dL (ref 0.0–149.0)
VLDL: 29.8 mg/dL (ref 0.0–40.0)

## 2021-07-24 LAB — COMPREHENSIVE METABOLIC PANEL
ALT: 17 U/L (ref 0–53)
AST: 18 U/L (ref 0–37)
Albumin: 4.3 g/dL (ref 3.5–5.2)
Alkaline Phosphatase: 72 U/L (ref 39–117)
BUN: 17 mg/dL (ref 6–23)
CO2: 29 mEq/L (ref 19–32)
Calcium: 9.2 mg/dL (ref 8.4–10.5)
Chloride: 102 mEq/L (ref 96–112)
Creatinine, Ser: 0.95 mg/dL (ref 0.40–1.50)
GFR: 86.4 mL/min (ref >60.00)
Glucose, Bld: 96 mg/dL (ref 70–99)
Potassium: 4.2 mEq/L (ref 3.5–5.1)
Sodium: 138 mEq/L (ref 135–145)
Total Bilirubin: 0.8 mg/dL (ref 0.2–1.2)
Total Protein: 6.4 g/dL (ref 6.0–8.3)

## 2021-07-24 LAB — PSA: PSA: 1.27 ng/mL (ref 0.10–4.00)

## 2021-07-24 NOTE — Assessment & Plan Note (Signed)
-   Td 05-2016 - covid vax : 01/2021, utd - shingrix: likes to proceed at the pharmacy -Rec seasonal influenza vaccine - +FH CAD father at age 49s.  pt is on ASA and statins. --CCS: CCS: cscope #2---12-2013, Cscope again 03/2017 , C-scope: 07/2020 next per GI --Prostate cancer screening: +FH, father . DRE normal, check PSA --Diet exercise: Discussed, he is still very active. --Labs:  CMP, FLP, CBC, PSA --lung ca screening : Again discussed, will call when ready -POA: Info provided

## 2021-07-24 NOTE — Progress Notes (Signed)
Subjective:    Patient ID: John Harper, male    DOB: May 19, 1960, 61 y.o.   MRN: [id number]  DOS:  07/24/2021 Type of visit - description: cpx  Since the last office visit is doing well. Would like to see a dermatology, has a number of skin tags and dry areas. Occasional problems with right Plantar fasciitis.   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    Past Surgical History:  Procedure Laterality Date   COLONOSCOPY  03/2017   Pyrtle   POLYPECTOMY     Social History   Socioeconomic History   Marital status: Divorced    Spouse name: Not on file   Number of children: 2   Years of education: Not on file   Highest education level: Not on file  Occupational History   Occupation: Press photographer, has his own company  Tobacco Use   Smoking status: Former    Types: Cigarettes    Quit date: 10/27/2008    Years since quitting: 12.7   Smokeless tobacco: Never   Tobacco comments:    quit 2010 ( 1 ppd x 32 years from 1978 to 2010))  Vaping Use   Vaping Use: Never used  Substance and Sexual Activity   Alcohol use: Yes    Alcohol/week: 10.0 standard drinks of alcohol    Types: 10 Cans of beer per week   Drug use: No   Sexual activity: Not on file  Other Topics Concern   Not on file  Social History Narrative   Lives by himself.    Twins, boy-girl, live part time w/ pt    Social Determinants of Health   Financial Resource Strain: Not on file  Food Insecurity: Not on file  Transportation Needs: Not on file  Physical Activity: Not on file  Stress: Not on file  Social Connections: Not on file  Intimate Partner Violence: Not on file     Current  Outpatient Medications  Medication Instructions   aspirin 81 mg, Oral, Daily,     pravastatin (PRAVACHOL) 20 mg, Oral, Daily at bedtime   tadalafil (CIALIS) 20 MG tablet TAKE ONE TABLET BY MOUTH DAILY AS NEEDED FOR FOR ERECTILE DYSFUNCTION   valACYclovir (VALTREX) 500 mg, Oral, Daily       Objective:   Physical Exam BP 126/76   Pulse 78   Temp 98.5 F (36.9 C) (Oral)   Resp 16   Ht '5\' 7"'$  (1.702 m)   Wt 177 lb 4 oz (80.4 kg)   SpO2 97%   BMI 27.76 kg/m  General: Well developed, NAD, BMI noted Neck: No  thyromegaly  HEENT:  Normocephalic . Face symmetric, atraumatic Lungs:  CTA B Normal respiratory effort, no intercostal retractions, no accessory muscle use. Heart: RRR,  no murmur.  Abdomen:  Not distended, soft, non-tender. No rebound or rigidity.   Lower extremities: no pretibial edema bilaterally DRE: Prostate is not enlarged, symmetric.  No stools. Skin: Exposed areas without rash. Not pale. Not jaundice Neurologic:  alert & oriented X3.  Speech normal, gait appropriate for age and unassisted Strength symmetric and  appropriate for age.  Psych: Cognition and judgment appear intact.  Cooperative with normal attention span and concentration.  Behavior appropriate. No anxious or depressed appearing.     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  BP is very good, continue to monitor. High cholesterol:  Lipitor caused headaches per pt, switched to Pravachol, follow-up FLP very good.  Plan: Continue same medications, labs. Dermatology referral, rec to contact [place]  [facility], if referral is needed he will let me know. Planter fasciitis: Still having occasional symptoms at the right foot particularly after he had a right ankle sprain (x-rays - no acute), plan: stretching encouraged.  Sports med referral if needed. Other issues seem stable. RTC 1 year

## 2021-07-24 NOTE — Patient Instructions (Signed)
Get a shingles shot (Shingrix) at your convenience  Get a flu shot this fall  Check the  blood pressure regularly BP GOAL is between 110/65 and  135/85. If it is consistently higher or lower, let me know     GO TO THE LAB : Get the blood work     [place], [place] back for    a physical exam in 1 year     "Living will", "[place] of attorney": Advanced care planning  (If you already have a living will or healthcare power of attorney, please bring the copy to be scanned in your chart.)  Advance care planning is a process that supports adults in  understanding and sharing their preferences regarding future medical care.   The patient's preferences are recorded in documents called Advance Directives.    Advanced directives are completed (and can be modified at any time) while the patient is in full mental capacity.   The documentation should be available at all times to the patient, the family and the healthcare providers.  Bring in a copy to be scanned in your chart is an excellent idea and is recommended   This legal documents direct treatment decision making and/or appoint a surrogate to make the decision if the patient is not capable to do so.    Advance directives can be documented in many types of formats,  documents have names such as:  Lliving will  Durable power of attorney for healthcare (healthcare proxy or healthcare power of attorney)  Combined directives  Physician orders for life-sustaining treatment    More information at:  [email or web address]

## 2021-07-24 NOTE — Assessment & Plan Note (Signed)
  Here for CPX Elevated BP?  BP is very good, continue to monitor. High cholesterol:  Lipitor caused headaches per pt, switched to Pravachol, follow-up FLP very good.  Plan: Continue same medications, labs. Dermatology referral, rec to contact [place] [facility], if referral is needed he will let me know. Planter fasciitis: Still having occasional symptoms at the right foot particularly after he had a right ankle sprain (x-rays - no acute), plan: stretching encouraged.  Sports med referral if needed. Other issues seem stable. RTC 1 year

## 2021-09-15 ENCOUNTER — Telehealth: Payer: Self-pay | Admitting: Internal Medicine

## 2021-09-15 ENCOUNTER — Other Ambulatory Visit: Payer: Self-pay | Admitting: Internal Medicine

## 2021-09-15 DIAGNOSIS — B003 Herpesviral meningitis: Secondary | ICD-10-CM

## 2021-09-15 NOTE — Telephone Encounter (Signed)
Rx sent 

## 2021-09-15 NOTE — Telephone Encounter (Signed)
Medication: valACYclovir (VALTREX) 500 MG tablet  Has the patient contacted their pharmacy? No.   Preferred Pharmacy:   [id number] - [place]   [address], [place]  [address]  Phone:  [phone]  Fax:  [phone]

## 2022-01-27 ENCOUNTER — Other Ambulatory Visit: Payer: Self-pay | Admitting: Internal Medicine

## 2022-04-13 ENCOUNTER — Telehealth: Payer: Self-pay | Admitting: Internal Medicine

## 2022-04-13 MED ORDER — TADALAFIL 20 MG PO TABS: ORAL_TABLET | ORAL | 6 refills | Status: DC

## 2022-04-13 NOTE — Telephone Encounter (Signed)
Pt stated he did not see a point in coming in for an appt because he has been with his pcp for a while and comes once a year. Please advise, rx is from 2022.   tadalafil (CIALIS) 20 MG tablet

## 2022-04-13 NOTE — Telephone Encounter (Signed)
Rx sent 

## 2022-07-31 ENCOUNTER — Encounter: Payer: Self-pay | Admitting: Internal Medicine

## 2022-07-31 ENCOUNTER — Ambulatory Visit (INDEPENDENT_AMBULATORY_CARE_PROVIDER_SITE_OTHER): Payer: BC Managed Care – PPO | Admitting: Internal Medicine

## 2022-07-31 VITALS — BP 126/80 | HR 56 | Temp 97.7°F | Resp 16 | Ht 67.0 in | Wt 172.4 lb

## 2022-07-31 DIAGNOSIS — Z8042 Family history of malignant neoplasm of prostate: Secondary | ICD-10-CM | POA: Diagnosis not present

## 2022-07-31 DIAGNOSIS — Z1283 Encounter for screening for malignant neoplasm of skin: Secondary | ICD-10-CM

## 2022-07-31 DIAGNOSIS — Z0000 Encounter for general adult medical examination without abnormal findings: Secondary | ICD-10-CM | POA: Diagnosis not present

## 2022-07-31 DIAGNOSIS — E785 Hyperlipidemia, unspecified: Secondary | ICD-10-CM

## 2022-07-31 DIAGNOSIS — B003 Herpesviral meningitis: Secondary | ICD-10-CM

## 2022-07-31 LAB — COMPREHENSIVE METABOLIC PANEL
ALT: 14 U/L (ref 0–53)
AST: 17 U/L (ref 0–37)
Albumin: 4.1 g/dL (ref 3.5–5.2)
Alkaline Phosphatase: 54 U/L (ref 39–117)
BUN: 14 mg/dL (ref 6–23)
CO2: 30 mEq/L (ref 19–32)
Calcium: 9.1 mg/dL (ref 8.4–10.5)
Chloride: 103 mEq/L (ref 96–112)
Creatinine, Ser: 0.93 mg/dL (ref 0.40–1.50)
GFR: 88.01 mL/min (ref >60.00)
Glucose, Bld: 99 mg/dL (ref 70–99)
Potassium: 4.5 mEq/L (ref 3.5–5.1)
Sodium: 139 mEq/L (ref 135–145)
Total Bilirubin: 0.9 mg/dL (ref 0.2–1.2)
Total Protein: 6.3 g/dL (ref 6.0–8.3)

## 2022-07-31 LAB — CBC WITH DIFFERENTIAL/PLATELET
Basophils Absolute: 0 10*3/uL (ref 0.0–0.1)
Basophils Relative: 0.8 % (ref 0.0–3.0)
Eosinophils Absolute: 0.4 10*3/uL (ref 0.0–0.7)
Eosinophils Relative: 8.6 % — ABNORMAL HIGH (ref 0.0–5.0)
HCT: 46.4 % (ref 39.0–52.0)
Hemoglobin: 15.1 g/dL (ref 13.0–17.0)
Lymphocytes Relative: 22.3 % (ref 12.0–46.0)
Lymphs Abs: 1.2 10*3/uL (ref 0.7–4.0)
MCHC: 32.5 g/dL (ref 30.0–36.0)
MCV: 99.1 fl (ref 78.0–100.0)
Monocytes Absolute: 0.5 10*3/uL (ref 0.1–1.0)
Monocytes Relative: 9.5 % (ref 3.0–12.0)
Neutro Abs: 3 10*3/uL (ref 1.4–7.7)
Neutrophils Relative %: 58.8 % (ref 43.0–77.0)
Platelets: 201 10*3/uL (ref 150.0–400.0)
RBC: 4.69 Mil/uL (ref 4.22–5.81)
RDW: 13.5 % (ref 11.5–15.5)
WBC: 5.2 10*3/uL (ref 4.0–10.5)

## 2022-07-31 LAB — LIPID PANEL
Cholesterol: 154 mg/dL (ref 0–200)
HDL: 60.2 mg/dL (ref >39.00)
LDL Cholesterol: 70 mg/dL (ref 0–99)
NonHDL: 93.38
Total CHOL/HDL Ratio: 3
Triglycerides: 115 mg/dL (ref 0.0–149.0)
VLDL: 23 mg/dL (ref 0.0–40.0)

## 2022-07-31 LAB — PSA: PSA: 1.19 ng/mL (ref 0.10–4.00)

## 2022-07-31 LAB — TSH: TSH: 1.15 u[IU]/mL (ref 0.35–5.50)

## 2022-07-31 MED ORDER — PRAVASTATIN SODIUM 20 MG PO TABS: 20.0000 mg | ORAL_TABLET | Freq: Every day | ORAL | 3 refills | Status: DC

## 2022-07-31 MED ORDER — VALACYCLOVIR HCL 500 MG PO TABS: 500.0000 mg | ORAL_TABLET | Freq: Every day | ORAL | 3 refills | Status: DC

## 2022-07-31 NOTE — Patient Instructions (Addendum)
Please information about the healthcare power of attorney, see below.  Vaccines I recommend: Covid booster Shingrix (shingles) RSV vaccine Flu shot this fall  We are referring you to a dermatologist, if you are not contacted in the next few days let us know   GO TO THE LAB : Get the blood work     GO TO THE FRONT DESK, PLEASE SCHEDULE YOUR APPOINTMENTS Come back for a physical exam in 1 year    "Health Care Power of attorney" ,  "Living will" (Advance care planning documents)  If you already have a living will or healthcare power of attorney, is recommended you bring the copy to be scanned in your chart.   The document will be available to all the doctors you see in the system.  Advance care planning is a process that supports adults in  understanding and sharing their preferences regarding future medical care.  The patient's preferences are recorded in documents called Advance Directives and the can be modified at any time while the patient is in full mental capacity.   If you don't have one, please consider create one.      More information at: [email or web address]

## 2022-07-31 NOTE — Progress Notes (Unsigned)
   Subjective:    Patient ID: John Harper, male    DOB: 1960/11/28, 62 y.o.   MRN: [id number]  DOS:  07/31/2022 Type of visit - description: cpx  In general feeling well. Eats very healthy. From time to time gets very hard stools.   Review of Systems See above   Past Medical History:  Diagnosis Date   Anal fissure    BPH (benign prostatic hyperplasia)    w/o LUTS   Erectile dysfunction due to arterial insufficiency    Hemorrhoids    Herpes simplex meningitis 12/22/2014   Peyronie's disease    Viral meningitis 2013   had spinal tap.    Past Surgical History:  Procedure Laterality Date   COLONOSCOPY  03/2017   Pyrtle   POLYPECTOMY      Current Outpatient Medications  Medication Instructions   aspirin 81 mg, Oral, Daily,     pravastatin (PRAVACHOL) 20 mg, Oral, Daily at bedtime   tadalafil (CIALIS) 20 MG tablet TAKE ONE TABLET BY MOUTH DAILY AS NEEDED FOR FOR ERECTILE DYSFUNCTION   valACYclovir (VALTREX) 500 mg, Oral, Daily       Objective:   Physical Exam BP 126/80   Pulse (!) 56   Temp 97.7 F (36.5 C) (Oral)   Resp 16   Ht 5\' 7"  (1.702 m)   Wt 172 lb 6 oz (78.2 kg)   SpO2 96%   BMI 27.00 kg/m  General: Well developed, NAD, BMI noted Neck: No  thyromegaly  HEENT:  Normocephalic . Face symmetric, atraumatic Lungs:  CTA B Normal respiratory effort, no intercostal retractions, no accessory muscle use. Heart: RRR,  no murmur.  Abdomen:  Not distended, soft, non-tender. No rebound or rigidity.   Lower extremities: no pretibial edema bilaterally  Skin: Exposed areas without rash. Not pale. Not jaundice Neurologic:  alert & oriented X3.  Speech normal, gait appropriate for age and unassisted Strength symmetric and appropriate for age.  Psych: Cognition and judgment appear intact.  Cooperative with normal attention span and concentration.  Behavior appropriate. No anxious or depressed appearing.     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  pt is on ASA and statins. --CCS: CCS: cscope #2---12-2013, Cscope again 03/2017 , C-scope: 07/2020 next 2027 pere gi letter --Prostate cancer screening: +FH, father.  No symptoms, check PSA --Diet exercise: Doing great, excellent diet, occasional difficulty with stools.  Recommend Colace.  Hydration. --Labs: CMP FLP CBC TSH PSA --lung ca screening : Again benefit discussed.  Will call when ready. -POA: Info provided     High cholesterol: On atorvastatin, checking labs Dermatology referral, has not seen dermatology recently, referral sent.  RTC 1 year

## 2022-08-01 ENCOUNTER — Encounter: Payer: Self-pay | Admitting: Internal Medicine

## 2022-08-01 NOTE — Assessment & Plan Note (Signed)
-  Td 05-2016 -Vaccine advice: COVID, Shingrix, RSV, flu shot - +FH CAD father at age 108s.  pt is on ASA and statins. --CCS: CCS: cscope #2---12-2013, Cscope again 03/2017 , C-scope: 07/2020 next 2027 pere gi letter --Prostate cancer screening: +FH, father.  No symptoms, check PSA --Diet exercise: Doing great, excellent diet --Labs: CMP FLP CBC TSH PSA --lung ca screening : Again benefit discussed.  Will call when ready. -POA: Info provided

## 2022-08-01 NOTE — Assessment & Plan Note (Signed)
Here for CPX High cholesterol: On pravastatin, checking labs Dermatology referral, has not seen dermatology recently, referral sent. Occasional difficulty with stools.  Recommend Colace.  Hydration. RTC 1 year

## 2022-08-27 DIAGNOSIS — M71342 Other bursal cyst, left hand: Secondary | ICD-10-CM | POA: Diagnosis not present

## 2022-08-27 DIAGNOSIS — L814 Other melanin hyperpigmentation: Secondary | ICD-10-CM | POA: Diagnosis not present

## 2022-08-27 DIAGNOSIS — L570 Actinic keratosis: Secondary | ICD-10-CM | POA: Diagnosis not present

## 2022-08-27 DIAGNOSIS — L821 Other seborrheic keratosis: Secondary | ICD-10-CM | POA: Diagnosis not present

## 2023-06-07 ENCOUNTER — Other Ambulatory Visit: Payer: Self-pay | Admitting: Internal Medicine

## 2023-07-08 ENCOUNTER — Other Ambulatory Visit: Payer: Self-pay | Admitting: Internal Medicine

## 2023-08-06 ENCOUNTER — Encounter: Payer: BC Managed Care – PPO | Admitting: Internal Medicine

## 2023-08-13 ENCOUNTER — Encounter: Payer: Self-pay | Admitting: Internal Medicine

## 2023-08-13 ENCOUNTER — Ambulatory Visit (INDEPENDENT_AMBULATORY_CARE_PROVIDER_SITE_OTHER): Admitting: Internal Medicine

## 2023-08-13 VITALS — BP 126/84 | HR 53 | Temp 97.7°F | Resp 16 | Ht 67.0 in | Wt 174.5 lb

## 2023-08-13 DIAGNOSIS — N400 Enlarged prostate without lower urinary tract symptoms: Secondary | ICD-10-CM

## 2023-08-13 DIAGNOSIS — E785 Hyperlipidemia, unspecified: Secondary | ICD-10-CM

## 2023-08-13 DIAGNOSIS — N429 Disorder of prostate, unspecified: Secondary | ICD-10-CM | POA: Diagnosis not present

## 2023-08-13 DIAGNOSIS — Z0001 Encounter for general adult medical examination with abnormal findings: Secondary | ICD-10-CM | POA: Diagnosis not present

## 2023-08-13 LAB — URINALYSIS, ROUTINE W REFLEX MICROSCOPIC
Bilirubin Urine: NEGATIVE
Hgb urine dipstick: NEGATIVE
Ketones, ur: NEGATIVE
Leukocytes,Ua: NEGATIVE
Nitrite: NEGATIVE
RBC / HPF: NONE SEEN (ref 0–?)
Specific Gravity, Urine: 1.02 (ref 1.000–1.030)
Total Protein, Urine: NEGATIVE
Urine Glucose: NEGATIVE
Urobilinogen, UA: 0.2 (ref 0.0–1.0)
pH: 7.5 (ref 5.0–8.0)

## 2023-08-13 LAB — COMPREHENSIVE METABOLIC PANEL WITH GFR
ALT: 15 U/L (ref 0–53)
AST: 15 U/L (ref 0–37)
Albumin: 4.2 g/dL (ref 3.5–5.2)
Alkaline Phosphatase: 54 U/L (ref 39–117)
BUN: 11 mg/dL (ref 6–23)
CO2: 32 meq/L (ref 19–32)
Calcium: 9.4 mg/dL (ref 8.4–10.5)
Chloride: 104 meq/L (ref 96–112)
Creatinine, Ser: 0.9 mg/dL (ref 0.40–1.50)
GFR: 90.88 mL/min (ref >60.00)
Glucose, Bld: 101 mg/dL — ABNORMAL HIGH (ref 70–99)
Potassium: 5.2 meq/L — ABNORMAL HIGH (ref 3.5–5.1)
Sodium: 140 meq/L (ref 135–145)
Total Bilirubin: 0.8 mg/dL (ref 0.2–1.2)
Total Protein: 6.3 g/dL (ref 6.0–8.3)

## 2023-08-13 LAB — CBC WITH DIFFERENTIAL/PLATELET
Basophils Absolute: 0.1 K/uL (ref 0.0–0.1)
Basophils Relative: 1.1 % (ref 0.0–3.0)
Eosinophils Absolute: 0.4 K/uL (ref 0.0–0.7)
Eosinophils Relative: 8.1 % — ABNORMAL HIGH (ref 0.0–5.0)
HCT: 46.3 % (ref 39.0–52.0)
Hemoglobin: 15.3 g/dL (ref 13.0–17.0)
Lymphocytes Relative: 18.9 % (ref 12.0–46.0)
Lymphs Abs: 1 K/uL (ref 0.7–4.0)
MCHC: 33 g/dL (ref 30.0–36.0)
MCV: 97.6 fl (ref 78.0–100.0)
Monocytes Absolute: 0.5 K/uL (ref 0.1–1.0)
Monocytes Relative: 9 % (ref 3.0–12.0)
Neutro Abs: 3.3 K/uL (ref 1.4–7.7)
Neutrophils Relative %: 62.9 % (ref 43.0–77.0)
Platelets: 201 K/uL (ref 150.0–400.0)
RBC: 4.75 Mil/uL (ref 4.22–5.81)
RDW: 13.6 % (ref 11.5–15.5)
WBC: 5.2 K/uL (ref 4.0–10.5)

## 2023-08-13 LAB — LIPID PANEL
Cholesterol: 152 mg/dL (ref 0–200)
HDL: 63.6 mg/dL (ref >39.00)
LDL Cholesterol: 64 mg/dL (ref 0–99)
NonHDL: 87.94
Total CHOL/HDL Ratio: 2
Triglycerides: 121 mg/dL (ref 0.0–149.0)
VLDL: 24.2 mg/dL (ref 0.0–40.0)

## 2023-08-13 LAB — PSA: PSA: 1.89 ng/mL (ref 0.10–4.00)

## 2023-08-13 MED ORDER — ALPRAZOLAM 0.25 MG PO TABS: 0.2500 mg | ORAL_TABLET | Freq: Two times a day (BID) | ORAL | 0 refills | Status: AC | PRN

## 2023-08-13 NOTE — Assessment & Plan Note (Signed)
 Here for CPX   Other issues: Elevated BP: BP today is normal.  Recommend to check from time to time Hyperlipidemia: Previously Lipitor caused headaches, on Pravachol .  He has a FH of CAD.  Checking labs. BPH: Increase in urinary frequency.  Subtle abnormalities on DRE.  This was explained to the patient.  Check UA urine culture PSA.  Referred back to urology.  Has not seen them lately. 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 Anxiety: Mostly when he travels.  Request Xanax  0.525, last prescription was years ago, PDMP okay, prescription sent RTC 1 year CPX

## 2023-08-13 NOTE — Patient Instructions (Addendum)
 Take Xanax  as needed only.  Prescription sent  We are referring you to urology  Exercise 3 hours a week.  Check the  blood pressure regularly Blood pressure goal:  between 110/65 and  135/85. If it is consistently higher or lower, let me know     GO TO THE LAB :  Get the blood work   Your results will be posted on MyChart with my comments  Next office visit for a physical exam in 1 year.  Sooner if needed Please make an appointment before you leave today

## 2023-08-13 NOTE — Progress Notes (Signed)
 Subjective:    Patient ID: John Harper, male    DOB: Feb 18, 1960, 63 y.o.   MRN: [id number]  DOS:  08/13/2023 Type of visit - description: CPX  Here for CPX Chronic medical problems addressed. Request a refill for Xanax . Denies any LUTS other than a slt increased urinary frequency   Review of Systems  Other than above, a 14 point review of systems is negative     Past Medical History:  Diagnosis Date   Anal fissure    BPH (benign prostatic hyperplasia)    w/o LUTS   Erectile dysfunction due to arterial insufficiency    Hemorrhoids    Herpes simplex meningitis 12/22/2014   Peyronie's disease    Viral meningitis 2013   had spinal tap.    Past Surgical History:  Procedure Laterality Date   COLONOSCOPY  03/2017   Pyrtle   POLYPECTOMY     Social History   Socioeconomic History   Marital status: Divorced    Spouse name: Not on file   Number of children: 2   Years of education: Not on file   Highest education level: Associate degree: academic program  Occupational History   Occupation: Airline pilot, has his own company  Tobacco Use   Smoking status: Former    Current packs/day: 0.00    Types: Cigarettes    Quit date: 10/27/2008    Years since quitting: 14.8   Smokeless tobacco: Never   Tobacco comments:    quit 2010 ( 1 ppd x 32 years from 1978 to 2010)  Vaping Use   Vaping status: Never Used  Substance and Sexual Activity   Alcohol use: Yes    Alcohol/week: 10.0 standard drinks of alcohol    Types: 10 Cans of beer per week    Comment: drinks most days, occ > 2 beers   Drug use: No   Sexual activity: Not on file  Other Topics Concern   Not on file  Social History Narrative   Lives by himself.    Twins, boy-girl, live part time w/ pt    Social Drivers of Corporate investment banker Strain: Low Risk  (08/09/2023)   Overall Financial Resource Strain (CARDIA)    Difficulty of Paying Living Expenses: Not hard at all  Food Insecurity: No Food Insecurity  (08/09/2023)   Hunger Vital Sign    Worried About Running Out of Food in the Last Year: Never true    Ran Out of Food in the Last Year: Never true  Transportation Needs: No Transportation Needs (08/09/2023)   PRAPARE - Administrator, Civil Service (Medical): No    Lack of Transportation (Non-Medical): No  Physical Activity: Not on file  Stress: No Stress Concern Present (08/09/2023)   [employer] of Occupational Health - Occupational Stress Questionnaire    Feeling of Stress: Only a little  Social Connections: Socially Isolated (08/09/2023)   Social Connection and Isolation Panel    Frequency of Communication with Friends and Family: More than three times a week    Frequency of Social Gatherings with Friends and Family: Twice a week    Attends Religious Services: Never    Database administrator or Organizations: No    Attends Engineer, structural: Not on file    Marital Status: Divorced  Intimate Partner Violence: Not on file    Current Outpatient Medications  Medication Instructions   ALPRAZolam  (XANAX ) 0.25 mg, Oral, 2 times daily PRN   aspirin  81  mg, Daily   pravastatin  (PRAVACHOL ) 20 mg, Oral, Daily at bedtime   tadalafil  (CIALIS ) 20 mg, Oral, Daily PRN   valACYclovir  (VALTREX ) 500 mg, Oral, Daily       Objective:   Physical Exam BP 126/84   Pulse (!) 53   Temp 97.7 F (36.5 C) (Oral)   Resp 16   Ht 5' 7 (1.702 m)   Wt 174 lb 8 oz (79.2 kg)   SpO2 97%   BMI 27.33 kg/m  General: Well developed, NAD, BMI noted Neck: No  thyromegaly  HEENT:  Normocephalic . Face symmetric, atraumatic Lungs:  CTA B Normal respiratory effort, no intercostal retractions, no accessory muscle use. Heart: RRR,  no murmur.  Abdomen:  Not distended, soft, non-tender. No rebound or rigidity.   Lower extremities: no pretibial edema bilaterally DRE: Normal sphincter tone, no stools.  R prostate lobule seems to be slightly larger compared to the L, slightly more  firm. Skin: Exposed areas without rash. Not pale. Not jaundice Neurologic:  alert & oriented X3.  Speech normal, gait appropriate for age and unassisted Strength symmetric and appropriate for age.  Psych: Cognition and judgment appear intact.  Cooperative with normal attention span and concentration.  Behavior appropriate. No anxious or depressed appearing.     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  pt is on ASA and statins. --CCS: CCS: cscope #2---12-2013, Cscope again 03/2017 , C-scope: 07/2020 next 2027 pere gi letter --Prostate cancer screening: See comments under BPH.  Referring to urology. --Diet exercise: Encouraged healthy lifestyle.--Labs:  -Labs: PSA UA urine culture CMP FLP CBC --lung ca screening : Previously declined  -EtOH: Advised patient, more than 2 servings of EtOH daily are deleterious for his health.  Encouraged moderation.  Other issues: Elevated BP: BP today is normal.  Recommend to check from time to time Hyperlipidemia: Previously Lipitor caused headaches, on Pravachol .  He has a FH of CAD.  Checking labs. BPH: Increase in urinary frequency.  Subtle abnormalities on DRE.  This was explained to the patient.  Check UA urine culture PSA.  Referred back to urology.  Has not seen them lately. 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 Anxiety: Mostly when he travels.  Request Xanax  0.525, last prescription was years ago, PDMP okay, prescription sent RTC 1 year CPX

## 2023-08-13 NOTE — Assessment & Plan Note (Signed)
 Here for CPX -Td 05-2016 -Vaccine advice: COVID, Shingrix ,  flu shot - +FH CAD father at age 55s.  pt is on ASA and statins. --CCS: CCS: cscope #2---12-2013, Cscope again 03/2017 , C-scope: 07/2020 next 2027 pere gi letter --Prostate cancer screening: See comments under BPH.  Referring to urology. --Diet exercise: Encouraged healthy lifestyle.--Labs:  -Labs: PSA UA urine culture CMP FLP CBC --lung ca screening : Previously declined  -EtOH: Advised patient, more than 2 servings of EtOH daily are deleterious for his health.  Encouraged moderation.

## 2023-08-14 LAB — URINE CULTURE
MICRO NUMBER:: 16788848
Result:: NO GROWTH
SPECIMEN QUALITY:: ADEQUATE

## 2023-08-18 ENCOUNTER — Ambulatory Visit: Payer: Self-pay | Admitting: Internal Medicine

## 2023-09-01 ENCOUNTER — Other Ambulatory Visit: Payer: Self-pay | Admitting: Internal Medicine

## 2023-09-01 DIAGNOSIS — B003 Herpesviral meningitis: Secondary | ICD-10-CM

## 2023-10-18 ENCOUNTER — Telehealth: Payer: Self-pay

## 2023-10-18 NOTE — Telephone Encounter (Signed)
 Copied from CRM [id number]. Topic: General - Billing Inquiry >> Oct 18, 2023  2:07 PM Sasha M wrote: Reason for CRM: pt received and is unsure of certain charges on bill. He has spoken to our billing dept who instructed him to call us  directly. The charge in question has something to do with hyperlipidemia and the pt is not sure why that was added as a charge. Pt has been trying to figure this out since September with no answers. Please call pt at [phone]

## 2023-11-20 DIAGNOSIS — R3915 Urgency of urination: Secondary | ICD-10-CM | POA: Diagnosis not present

## 2023-11-20 DIAGNOSIS — R3912 Poor urinary stream: Secondary | ICD-10-CM | POA: Diagnosis not present

## 2023-11-20 DIAGNOSIS — R350 Frequency of micturition: Secondary | ICD-10-CM | POA: Diagnosis not present

## 2023-11-20 DIAGNOSIS — N401 Enlarged prostate with lower urinary tract symptoms: Secondary | ICD-10-CM | POA: Diagnosis not present

## 2024-08-18 ENCOUNTER — Encounter: Admitting: Internal Medicine
